# Patient Record
Sex: Male | Born: 1960 | Race: Black or African American | Hispanic: No | Marital: Married | Smoking: Never smoker
Health system: Southern US, Community
[De-identification: ages and names within clinical notes are randomized; demographics above are authoritative.]

## PROBLEM LIST (undated history)

## (undated) DIAGNOSIS — R3915 Urgency of urination: Secondary | ICD-10-CM

## (undated) DIAGNOSIS — R531 Weakness: Secondary | ICD-10-CM

## (undated) DIAGNOSIS — M1711 Unilateral primary osteoarthritis, right knee: Secondary | ICD-10-CM

## (undated) DIAGNOSIS — Z96651 Presence of right artificial knee joint: Secondary | ICD-10-CM

## (undated) DIAGNOSIS — K219 Gastro-esophageal reflux disease without esophagitis: Secondary | ICD-10-CM

## (undated) DIAGNOSIS — R350 Frequency of micturition: Secondary | ICD-10-CM

## (undated) DIAGNOSIS — M2540 Effusion, unspecified joint: Secondary | ICD-10-CM

## (undated) DIAGNOSIS — M1712 Unilateral primary osteoarthritis, left knee: Secondary | ICD-10-CM

## (undated) DIAGNOSIS — R569 Unspecified convulsions: Secondary | ICD-10-CM

## (undated) DIAGNOSIS — E119 Type 2 diabetes mellitus without complications: Secondary | ICD-10-CM

## (undated) DIAGNOSIS — Z8719 Personal history of other diseases of the digestive system: Secondary | ICD-10-CM

## (undated) DIAGNOSIS — E7800 Pure hypercholesterolemia, unspecified: Secondary | ICD-10-CM

## (undated) DIAGNOSIS — I10 Essential (primary) hypertension: Secondary | ICD-10-CM

## (undated) DIAGNOSIS — R351 Nocturia: Secondary | ICD-10-CM

## (undated) DIAGNOSIS — K5900 Constipation, unspecified: Secondary | ICD-10-CM

## (undated) DIAGNOSIS — M2550 Pain in unspecified joint: Secondary | ICD-10-CM

## (undated) HISTORY — PX: KNEE ARTHROSCOPY: SHX127

## (undated) HISTORY — PX: MOUTH SURGERY: SHX715

## (undated) HISTORY — PX: ANKLE FRACTURE SURGERY: SHX122

## (undated) HISTORY — DX: Gastro-esophageal reflux disease without esophagitis: K21.9

## (undated) HISTORY — PX: JOINT REPLACEMENT: SHX530

## (undated) HISTORY — PX: COLONOSCOPY: SHX174

## (undated) HISTORY — DX: Essential (primary) hypertension: I10

## (undated) HISTORY — PX: HAMMER TOE SURGERY: SHX385

---
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
## 1999-09-07 ENCOUNTER — Emergency Department (HOSPITAL_COMMUNITY): Admission: EM | Admit: 1999-09-07 | Discharge: 1999-09-07 | Payer: Self-pay | Admitting: Emergency Medicine

## 1999-09-07 ENCOUNTER — Encounter: Payer: Self-pay | Admitting: Emergency Medicine

## 1999-10-17 ENCOUNTER — Ambulatory Visit (HOSPITAL_BASED_OUTPATIENT_CLINIC_OR_DEPARTMENT_OTHER): Admission: RE | Admit: 1999-10-17 | Discharge: 1999-10-17 | Payer: Self-pay | Admitting: Orthopedic Surgery

## 2001-03-29 ENCOUNTER — Emergency Department (HOSPITAL_COMMUNITY): Admission: EM | Admit: 2001-03-29 | Discharge: 2001-03-29 | Payer: Self-pay | Admitting: Emergency Medicine

## 2001-03-29 ENCOUNTER — Encounter: Payer: Self-pay | Admitting: Emergency Medicine

## 2012-02-25 ENCOUNTER — Emergency Department (HOSPITAL_COMMUNITY)
Admission: EM | Admit: 2012-02-25 | Discharge: 2012-02-25 | Disposition: A | Discharge status: Home / Self Care | Payer: No Typology Code available for payment source | Attending: Emergency Medicine | Admitting: Emergency Medicine

## 2012-02-25 ENCOUNTER — Encounter (HOSPITAL_COMMUNITY): Payer: Self-pay | Admitting: *Deleted

## 2012-02-25 ENCOUNTER — Emergency Department (HOSPITAL_COMMUNITY): Payer: No Typology Code available for payment source

## 2012-02-25 DIAGNOSIS — M549 Dorsalgia, unspecified: Secondary | ICD-10-CM

## 2012-02-25 DIAGNOSIS — Y9389 Activity, other specified: Secondary | ICD-10-CM | POA: Insufficient documentation

## 2012-02-25 DIAGNOSIS — IMO0002 Reserved for concepts with insufficient information to code with codable children: Secondary | ICD-10-CM | POA: Insufficient documentation

## 2012-02-25 DIAGNOSIS — M25559 Pain in unspecified hip: Secondary | ICD-10-CM

## 2012-02-25 DIAGNOSIS — Y92410 Unspecified street and highway as the place of occurrence of the external cause: Secondary | ICD-10-CM | POA: Insufficient documentation

## 2012-02-25 DIAGNOSIS — S79919A Unspecified injury of unspecified hip, initial encounter: Secondary | ICD-10-CM | POA: Insufficient documentation

## 2012-02-25 DIAGNOSIS — S79929A Unspecified injury of unspecified thigh, initial encounter: Secondary | ICD-10-CM | POA: Insufficient documentation

## 2012-02-25 MED ORDER — OXYCODONE-ACETAMINOPHEN 5-325 MG PO TABS
2.0000 | ORAL_TABLET | Freq: Once | ORAL | Status: AC
  Administered 2012-02-25: 2 via ORAL
  Filled 2012-02-25: qty 2

## 2012-02-25 MED ORDER — OXYCODONE-ACETAMINOPHEN 5-325 MG PO TABS: 1.0000 | ORAL_TABLET | Freq: Four times a day (QID) | ORAL | Status: DC | PRN

## 2012-02-25 MED ORDER — METHOCARBAMOL 500 MG PO TABS
500.0000 mg | ORAL_TABLET | Freq: Once | ORAL | Status: AC
  Administered 2012-02-25: 500 mg via ORAL
  Filled 2012-02-25: qty 1

## 2012-02-25 MED ORDER — METHOCARBAMOL 500 MG PO TABS: 500.0000 mg | ORAL_TABLET | Freq: Two times a day (BID) | ORAL | Status: DC
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
## 2012-02-25 NOTE — ED Notes (Signed)
Pain extends to lower back, right knee, right hip. A&Ox4, ambulatory, nad.
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
## 2012-02-26 NOTE — ED Provider Notes (Signed)
Medical screening examination/treatment/procedure(s) were performed by non-physician practitioner and as supervising physician I was immediately available for consultation/collaboration.   Carleene Cooper III, MD 02/26/12 [phone]

## 2012-03-14 ENCOUNTER — Encounter (HOSPITAL_COMMUNITY): Payer: Self-pay | Admitting: Emergency Medicine

## 2012-03-14 ENCOUNTER — Emergency Department (INDEPENDENT_AMBULATORY_CARE_PROVIDER_SITE_OTHER)
Admission: EM | Admit: 2012-03-14 | Discharge: 2012-03-14 | Disposition: A | Discharge status: Home / Self Care | Payer: Self-pay | Source: Home / Self Care | Attending: Family Medicine | Admitting: Family Medicine

## 2012-03-14 DIAGNOSIS — M549 Dorsalgia, unspecified: Secondary | ICD-10-CM

## 2012-03-14 DIAGNOSIS — M25569 Pain in unspecified knee: Secondary | ICD-10-CM

## 2012-03-14 MED ORDER — KETOROLAC TROMETHAMINE 60 MG/2ML IM SOLN
60.0000 mg | Freq: Once | INTRAMUSCULAR | Status: AC
  Administered 2012-03-14: 60 mg via INTRAMUSCULAR

## 2012-03-14 MED ORDER — KETOROLAC TROMETHAMINE 60 MG/2ML IM SOLN
INTRAMUSCULAR | Status: AC
  Filled 2012-03-14: qty 2

## 2012-03-14 MED ORDER — MELOXICAM 15 MG PO TABS: 15.0000 mg | ORAL_TABLET | Freq: Every day | ORAL | Status: DC

## 2012-03-14 MED ORDER — PREDNISONE 20 MG PO TABS: 20.0000 mg | ORAL_TABLET | Freq: Two times a day (BID) | ORAL | Status: DC

## 2012-03-14 MED ORDER — TRAMADOL HCL 50 MG PO TABS: 50.0000 mg | ORAL_TABLET | Freq: Four times a day (QID) | ORAL | Status: DC | PRN
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
## 2012-03-15 NOTE — ED Provider Notes (Signed)
Medical screening examination/treatment/procedure(s) were performed by resident physician or non-physician practitioner and as supervising physician I was immediately available for consultation/collaboration.   Jennalee Greaves DOUGLAS MD.    Jenee Spaugh D Janette Harvie, MD 03/15/12 1822 

## 2013-03-30 ENCOUNTER — Other Ambulatory Visit: Payer: Self-pay | Admitting: Family Medicine

## 2013-03-30 ENCOUNTER — Ambulatory Visit
Admission: RE | Admit: 2013-03-30 | Discharge: 2013-03-30 | Disposition: A | Discharge status: Home / Self Care | Payer: Commercial Managed Care - HMO | Source: Ambulatory Visit | Attending: Family Medicine | Admitting: Family Medicine

## 2013-03-30 DIAGNOSIS — Z006 Encounter for examination for normal comparison and control in clinical research program: Secondary | ICD-10-CM

## 2013-10-14 ENCOUNTER — Ambulatory Visit (INDEPENDENT_AMBULATORY_CARE_PROVIDER_SITE_OTHER): Payer: Commercial Managed Care - HMO | Admitting: Family Medicine

## 2013-10-14 ENCOUNTER — Ambulatory Visit (INDEPENDENT_AMBULATORY_CARE_PROVIDER_SITE_OTHER): Payer: Commercial Managed Care - HMO

## 2013-10-14 VITALS — BP 124/76 | HR 76 | Temp 98.4°F | Resp 18

## 2013-10-14 DIAGNOSIS — M25579 Pain in unspecified ankle and joints of unspecified foot: Secondary | ICD-10-CM

## 2013-10-14 DIAGNOSIS — M25569 Pain in unspecified knee: Secondary | ICD-10-CM

## 2013-10-14 DIAGNOSIS — M170 Bilateral primary osteoarthritis of knee: Secondary | ICD-10-CM

## 2013-10-14 DIAGNOSIS — M25571 Pain in right ankle and joints of right foot: Secondary | ICD-10-CM

## 2013-10-14 DIAGNOSIS — M25561 Pain in right knee: Secondary | ICD-10-CM

## 2013-10-14 DIAGNOSIS — M1710 Unilateral primary osteoarthritis, unspecified knee: Secondary | ICD-10-CM

## 2013-10-14 NOTE — Patient Instructions (Addendum)
I suspect your knee pain is prior arthritis pain, but will check uric acid to look at gout as possibility. If pain persists - can follow up with Dr. Alfonso Ramus as discussed.  Since we can not use antiinflammatories due to your esophagus issues, ok to continue tramadol as needed for your pain.  Wear the walking boot if your other ankle brace is not controlling the pain. Use crutches you have or cane if needed for stability when walking with the walking boot.  Elevate the ankle, ice to area, and if ankle not improving in next week - return here or with Dr. Alfonso Ramus.  Return to the clinic or go to the nearest emergency room if any of your symptoms worsen or new symptoms occur.
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
## 2013-10-15 LAB — URIC ACID: URIC ACID, SERUM: 5 mg/dL (ref 4.0–7.8)

## 2013-10-16 ENCOUNTER — Encounter: Payer: Self-pay | Admitting: Family Medicine

## 2013-10-20 ENCOUNTER — Other Ambulatory Visit: Payer: Self-pay | Admitting: Family Medicine

## 2013-11-28 ENCOUNTER — Ambulatory Visit: Payer: Commercial Managed Care - HMO | Admitting: Podiatry

## 2013-12-04 ENCOUNTER — Ambulatory Visit (INDEPENDENT_AMBULATORY_CARE_PROVIDER_SITE_OTHER): Payer: Commercial Managed Care - HMO | Admitting: Sports Medicine

## 2013-12-04 ENCOUNTER — Encounter: Payer: Self-pay | Admitting: Sports Medicine

## 2013-12-04 VITALS — BP 115/85 | HR 101 | Ht 79.0 in | Wt 265.0 lb

## 2013-12-04 DIAGNOSIS — M2170 Unequal limb length (acquired), unspecified site: Secondary | ICD-10-CM

## 2013-12-04 DIAGNOSIS — M25579 Pain in unspecified ankle and joints of unspecified foot: Secondary | ICD-10-CM

## 2013-12-04 DIAGNOSIS — M79671 Pain in right foot: Secondary | ICD-10-CM | POA: Insufficient documentation

## 2013-12-04 NOTE — Assessment & Plan Note (Addendum)
Gait is affected with this difference. He swings with his right leg and supinates.  - heel lift 5/16" added to right foot green sport insole  - didn't completely correct. Will try to correct more based on his trail after three weeks
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
## 2013-12-04 NOTE — Assessment & Plan Note (Addendum)
Most likely related to previous crush injury and chronic changes as seen on films. Patient has a pes cavus but valgus while standing. His arches are effected due to this. Will try the green sport insoles to begin with.  - green sport insole  - scaphoid pad added b/l  - heel lift (5/16") placed on right due to leg length discrepancy. Did not completely correct leg length difference  - will try this for three weeks then f/u for consideration of custom orthotics. Will need further correction of his leg length discrepancy.

## 2013-12-27 ENCOUNTER — Encounter: Payer: Self-pay | Admitting: Sports Medicine

## 2013-12-27 ENCOUNTER — Ambulatory Visit (INDEPENDENT_AMBULATORY_CARE_PROVIDER_SITE_OTHER): Payer: Commercial Managed Care - HMO | Admitting: Sports Medicine

## 2013-12-27 VITALS — BP 114/72 | Ht 79.0 in | Wt 265.0 lb

## 2013-12-27 DIAGNOSIS — M25579 Pain in unspecified ankle and joints of unspecified foot: Secondary | ICD-10-CM

## 2013-12-27 DIAGNOSIS — M2170 Unequal limb length (acquired), unspecified site: Secondary | ICD-10-CM

## 2013-12-27 NOTE — Progress Notes (Signed)
Patient ID: Ryan Bright, male   DOB: 1961-02-15, 53 y.o.   MRN: [id number]  Patient comes in today for custom orthotics. He has noticed quite a bit of improvement with his green sports insoles with a 5/16 inch lift on the right to help partially correct a 3 cm leg length difference. He has discontinued his ankle brace as he feels like the inserts, particularly the correction in his leg length discrepancy, has help provide him with more ankle stability. He is here today with his wife.  Custom orthotics were constructed for the patient today. A total of 30 minutes was spent with the patient with greater than 50% of that time in face-to-face consultation discussing orthotic construction, fitting, and instruction. Patient found the orthotics very comfortable prior to leaving the office. He still has dynamic genu valgus with walking but the custom orthotics better correct his leg length discrepancy. I've asked him to follow-up with me in another 3-4 weeks.  Patient was fitted for a : standard, cushioned, semi-rigid orthotic. The orthotic was heated and afterward the patient stood on the orthotic blank positioned on the orthotic stand. The patient was positioned in subtalar neutral position and 10 degrees of ankle dorsiflexion in a weight bearing stance. After completion of molding, a stable base was applied to the orthotic blank. The blank was ground to a stable position for weight bearing. Size:16 Base: blue EVA Posting: none Additional orthotic padding: none  **I added two of the blue EVA bases to the right orthotic in an effort to better correct his leg length discrepancy. This seemed to work quite well but I will re-evaluate when he returns in 3 weeks.

## 2014-01-17 ENCOUNTER — Ambulatory Visit (INDEPENDENT_AMBULATORY_CARE_PROVIDER_SITE_OTHER): Payer: Commercial Managed Care - HMO | Admitting: Sports Medicine

## 2014-01-17 ENCOUNTER — Encounter: Payer: Self-pay | Admitting: Sports Medicine

## 2014-01-17 VITALS — BP 121/75 | Ht 79.0 in | Wt 260.0 lb

## 2014-01-17 DIAGNOSIS — M25579 Pain in unspecified ankle and joints of unspecified foot: Secondary | ICD-10-CM

## 2014-01-18 NOTE — Progress Notes (Signed)
Patient ID: Ryan Bright, male   DOB: 12/15/1960, 53 y.o.   MRN: [id number]  Patient comes in today for follow-up. Since our last office visit the patient has return to the office to have his orthotics adjusted. We added an extra layer of blue EVA to the base of the right insert to help correct a leg length discrepancy but the patient found that this was too uncomfortable and did not fit well in his shoe. He returned to the office and Governor Specking was able to grind down the base which has made it much more comfortable. Since his last office visit he has also seen Dr. Noemi Chapel. He has had chronic right knee pain and recently got a cortisone injection which has helped but he is scheduled to undergo total knee arthroplasty on February 15. Although his orthotic does not give him a full correction of his leg length discrepancy he does find it to be quite comfortable and he is having much less foot pain. Since he is also planning to undergo total knee arthroplasty soon which may further change the length of his right leg I am going to hold on any further adjustments at this time. Patient will follow-up with me once he has been released from Dr. Noemi Chapel at which point we will decide whether or not we need to make any further adjustments to his orthotics.

## 2014-03-21 ENCOUNTER — Encounter (HOSPITAL_COMMUNITY): Payer: Self-pay | Admitting: Physician Assistant

## 2014-03-21 ENCOUNTER — Other Ambulatory Visit (HOSPITAL_COMMUNITY): Payer: Commercial Managed Care - HMO

## 2014-03-21 DIAGNOSIS — M1611 Unilateral primary osteoarthritis, right hip: Secondary | ICD-10-CM | POA: Diagnosis not present

## 2014-03-21 DIAGNOSIS — I10 Essential (primary) hypertension: Secondary | ICD-10-CM | POA: Diagnosis present

## 2014-03-21 DIAGNOSIS — K219 Gastro-esophageal reflux disease without esophagitis: Secondary | ICD-10-CM | POA: Diagnosis present

## 2014-03-21 DIAGNOSIS — M1711 Unilateral primary osteoarthritis, right knee: Secondary | ICD-10-CM | POA: Diagnosis present

## 2014-03-21 NOTE — H&P (Signed)
TOTAL KNEE ADMISSION H&P  Patient is being admitted for right total knee arthroplasty.  Subjective:  Chief Complaint:right knee pain.  HPI: Ryan Bright, 54 y.o. male, has a history of pain and functional disability in the right knee due to arthritis and has failed non-surgical conservative treatments for greater than 12 weeks to includeNSAID's and/or analgesics, corticosteriod injections, viscosupplementation injections, flexibility and strengthening excercises, supervised PT with diminished ADL's post treatment, use of assistive devices and activity modification.  Onset of symptoms was gradual, starting 10 years ago with gradually worsening course since that time. The patient noted prior procedures on the knee to include  arthroscopy and menisectomy on the right knee(s).  Patient currently rates pain in the right knee(s) at 10 out of 10 with activity. Patient has night pain, worsening of pain with activity and weight bearing, pain that interferes with activities of daily living, crepitus and joint swelling.  Patient has evidence of subchondral sclerosis, periarticular osteophytes and joint space narrowing by imaging studies.  There is no active infection.  Patient Active Problem List   Diagnosis Date Noted  . Primary localized osteoarthritis of right knee   . GERD (gastroesophageal reflux disease)   . Hypertension   . Right foot pain 12/04/2013  . Leg length discrepancy 12/04/2013   Past Medical History  Diagnosis Date  . Hypertension   . GERD (gastroesophageal reflux disease)   . Primary localized osteoarthritis of right knee     Past Surgical History  Procedure Laterality Date  . Knee surgery      bilateral  . Foot surgery    . Ankle surgery    . Head surgery    . Mouth surgery      No current facility-administered medications for this encounter.  Current outpatient prescriptions:  .  amLODipine (NORVASC) 10 MG tablet, Take 10 mg by mouth daily., Disp: , Rfl:  .  aspirin 325  MG tablet, Take 325 mg by mouth daily., Disp: , Rfl:  .  lisinopril (PRINIVIL,ZESTRIL) 40 MG tablet, Take 40 mg by mouth daily., Disp: , Rfl:  .  pantoprazole (PROTONIX) 40 MG tablet, Take 40 mg by mouth 2 (two) times daily., Disp: , Rfl:  .  predniSONE (DELTASONE) 20 MG tablet, Take 1 tablet (20 mg total) by mouth 2 (two) times daily., Disp: 6 tablet, Rfl: 0 .  traMADol (ULTRAM) 50 MG tablet, Take 1 tablet (50 mg total) by mouth every 6 (six) hours as needed for pain., Disp: 15 tablet, Rfl: 0 .  FLUVIRIN 0.5 ML SUSY, , Disp: , Rfl:  .  methocarbamol (ROBAXIN) 500 MG tablet, Take 1 tablet (500 mg total) by mouth 2 (two) times daily. (Patient not taking: Reported on 03/20/2014), Disp: 20 tablet, Rfl: 0 .  oxyCODONE-acetaminophen (PERCOCET) 5-325 MG per tablet, Take 1-2 tablets by mouth every 6 (six) hours as needed for pain. (Patient not taking: Reported on 03/20/2014), Disp: 8 tablet, Rfl: 0 Allergies  Allergen Reactions  . Meloxicam Other (See Comments)    irritated esophagus causing swelling and closure affecting breathing     History  Substance Use Topics  . Smoking status: Never Smoker   . Smokeless tobacco: Not on file  . Alcohol Use: No    Family History  Problem Relation Age of Onset  . Cancer Mother   . Diabetes Mother   . Heart attack Father   . Cancer Sister   . Diabetes Brother      Review of Systems  Constitutional: Negative.  HENT: Negative.   Eyes: Negative.   Respiratory: Negative.   Cardiovascular: Negative.   Gastrointestinal: Negative.   Genitourinary: Negative.   Musculoskeletal: Positive for joint pain.       Right knee pain  Skin: Negative.   Neurological: Negative.   Endo/Heme/Allergies: Negative.   Psychiatric/Behavioral: Negative.     Objective:  Physical Exam  Constitutional: He is oriented to person, place, and time. He appears well-developed and well-nourished.  HENT:  Head: Normocephalic and atraumatic.  Mouth/Throat: Oropharynx is clear and  moist.  Eyes: Conjunctivae and EOM are normal. Pupils are equal, round, and reactive to light.  Neck: Neck supple.  Cardiovascular: Normal rate, regular rhythm and normal heart sounds.   Respiratory: Effort normal and breath sounds normal.  GI: Soft.  Genitourinary:  Not pertinent to current symptomatology therefore not examined.  Musculoskeletal:  Examination of his right knee reveals moderate valgus deformity pain medially and laterally 1+ synovitis 2+ crepitation, range of motion is from -5 to 135 degrees knee is stable with normal patella tracking. Exam of the left knee reveals full range of motion without pain swelling weakness or instability. Vascular exam: pulses 2+ and symmetric.  Neurological: He is alert and oriented to person, place, and time.  Skin: Skin is warm and dry.  Psychiatric: He has a normal mood and affect. His behavior is normal.    Vital signs in last 24 hours: Temp:  [98.6 F (37 C)] 98.6 F (37 C) (02/03 1300) Pulse Rate:  [77] 77 (02/03 1300) BP: (135)/(73) 135/73 mmHg (02/03 1300) SpO2:  [96 %] 96 % (02/03 1300) Weight:  [112.946 kg (249 lb)] 112.946 kg (249 lb) (02/03 1300)  Labs:   Estimated body mass index is 28.04 kg/(m^2) as calculated from the following:   Height as of this encounter: 6\' 7"  (2.007 m).   Weight as of this encounter: 112.946 kg (249 lb).   Imaging Review Plain radiographs demonstrate severe degenerative joint disease of the right knee(s). The overall alignment issignificant valgus. The bone quality appears to be good for age and reported activity level.  Assessment/Plan:  End stage arthritis, right knee  Principal Problem:   Primary localized osteoarthritis of right knee Active Problems:   Leg length discrepancy   GERD (gastroesophageal reflux disease)   Hypertension   The patient history, physical examination, clinical judgment of the provider and imaging studies are consistent with end stage degenerative joint disease of  the right knee(s) and total knee arthroplasty is deemed medically necessary. The treatment options including medical management, injection therapy arthroscopy and arthroplasty were discussed at length. The risks and benefits of total knee arthroplasty were presented and reviewed. The risks due to aseptic loosening, infection, stiffness, patella tracking problems, thromboembolic complications and other imponderables were discussed. The patient acknowledged the explanation, agreed to proceed with the plan and consent was signed. Patient is being admitted for inpatient treatment for surgery, pain control, PT, OT, prophylactic antibiotics, VTE prophylaxis, progressive ambulation and ADL's and discharge planning. The patient is planning to be discharged home with home health services  Tricia Oaxaca A. Kaleen Mask Physician Assistant [facility] [phone]  03/21/2014, 3:03 PM

## 2014-03-22 ENCOUNTER — Encounter (HOSPITAL_COMMUNITY)
Admission: RE | Admit: 2014-03-22 | Discharge: 2014-03-22 | Disposition: A | Discharge status: Home / Self Care | Payer: Medicare HMO | Source: Ambulatory Visit | Attending: Orthopedic Surgery | Admitting: Orthopedic Surgery

## 2014-03-22 ENCOUNTER — Encounter (HOSPITAL_COMMUNITY): Payer: Self-pay

## 2014-03-22 DIAGNOSIS — M179 Osteoarthritis of knee, unspecified: Secondary | ICD-10-CM | POA: Insufficient documentation

## 2014-03-22 DIAGNOSIS — Z0183 Encounter for blood typing: Secondary | ICD-10-CM | POA: Diagnosis not present

## 2014-03-22 DIAGNOSIS — Z01810 Encounter for preprocedural cardiovascular examination: Secondary | ICD-10-CM | POA: Insufficient documentation

## 2014-03-22 DIAGNOSIS — Z7982 Long term (current) use of aspirin: Secondary | ICD-10-CM | POA: Diagnosis not present

## 2014-03-22 DIAGNOSIS — Z01812 Encounter for preprocedural laboratory examination: Secondary | ICD-10-CM | POA: Insufficient documentation

## 2014-03-22 HISTORY — DX: Urgency of urination: R39.15

## 2014-03-22 HISTORY — DX: Frequency of micturition: R35.0

## 2014-03-22 HISTORY — DX: Weakness: R53.1

## 2014-03-22 HISTORY — DX: Unspecified convulsions: R56.9

## 2014-03-22 HISTORY — DX: Constipation, unspecified: K59.00

## 2014-03-22 HISTORY — DX: Nocturia: R35.1

## 2014-03-22 HISTORY — DX: Effusion, unspecified joint: M25.40

## 2014-03-22 HISTORY — DX: Personal history of other diseases of the digestive system: Z87.19

## 2014-03-22 HISTORY — DX: Pain in unspecified joint: M25.50

## 2014-03-22 LAB — PROTIME-INR
INR: 0.99 (ref 0.00–1.49)
PROTHROMBIN TIME: 13.2 s (ref 11.6–15.2)

## 2014-03-22 LAB — COMPREHENSIVE METABOLIC PANEL
ALBUMIN: 4.1 g/dL (ref 3.5–5.2)
ALT: 18 U/L (ref 0–53)
AST: 22 U/L (ref 0–37)
Alkaline Phosphatase: 108 U/L (ref 39–117)
Anion gap: 9 (ref 5–15)
BUN: 7 mg/dL (ref 6–23)
CHLORIDE: 105 mmol/L (ref 96–112)
CO2: 27 mmol/L (ref 19–32)
CREATININE: 0.94 mg/dL (ref 0.50–1.35)
Calcium: 9.3 mg/dL (ref 8.4–10.5)
GFR calc Af Amer: >90 mL/min (ref >90)
GFR calc non Af Amer: >90 mL/min (ref >90)
GLUCOSE: 86 mg/dL (ref 70–99)
POTASSIUM: 3.9 mmol/L (ref 3.5–5.1)
SODIUM: 141 mmol/L (ref 135–145)
TOTAL PROTEIN: 7.5 g/dL (ref 6.0–8.3)
Total Bilirubin: 0.6 mg/dL (ref 0.3–1.2)

## 2014-03-22 LAB — TYPE AND SCREEN
ABO/RH(D): AB POS
Antibody Screen: NEGATIVE

## 2014-03-22 LAB — URINALYSIS, ROUTINE W REFLEX MICROSCOPIC
Bilirubin Urine: NEGATIVE
GLUCOSE, UA: NEGATIVE mg/dL
HGB URINE DIPSTICK: NEGATIVE
KETONES UR: NEGATIVE mg/dL
Leukocytes, UA: NEGATIVE
Nitrite: NEGATIVE
PROTEIN: NEGATIVE mg/dL
SPECIFIC GRAVITY, URINE: 1.013 (ref 1.005–1.030)
Urobilinogen, UA: 1 mg/dL (ref 0.0–1.0)
pH: 7.5 (ref 5.0–8.0)

## 2014-03-22 LAB — ABO/RH: ABO/RH(D): AB POS

## 2014-03-22 LAB — CBC WITH DIFFERENTIAL/PLATELET
BASOS PCT: 1 % (ref 0–1)
Basophils Absolute: 0 10*3/uL (ref 0.0–0.1)
EOS ABS: 0.2 10*3/uL (ref 0.0–0.7)
Eosinophils Relative: 4 % (ref 0–5)
HCT: 38.5 % — ABNORMAL LOW (ref 39.0–52.0)
Hemoglobin: 12.6 g/dL — ABNORMAL LOW (ref 13.0–17.0)
LYMPHS ABS: 3.3 10*3/uL (ref 0.7–4.0)
Lymphocytes Relative: 61 % — ABNORMAL HIGH (ref 12–46)
MCH: 27.5 pg (ref 26.0–34.0)
MCHC: 32.7 g/dL (ref 30.0–36.0)
MCV: 83.9 fL (ref 78.0–100.0)
Monocytes Absolute: 0.6 10*3/uL (ref 0.1–1.0)
Monocytes Relative: 11 % (ref 3–12)
Neutro Abs: 1.2 10*3/uL — ABNORMAL LOW (ref 1.7–7.7)
Neutrophils Relative %: 23 % — ABNORMAL LOW (ref 43–77)
Platelets: 273 10*3/uL (ref 150–400)
RBC: 4.59 MIL/uL (ref 4.22–5.81)
RDW: 13.2 % (ref 11.5–15.5)
WBC: 5.3 10*3/uL (ref 4.0–10.5)

## 2014-03-22 LAB — SURGICAL PCR SCREEN
MRSA, PCR: NEGATIVE
Staphylococcus aureus: NEGATIVE

## 2014-03-22 LAB — APTT: aPTT: 30 seconds (ref 24–37)

## 2014-03-22 MED ORDER — POVIDONE-IODINE 7.5 % EX SOLN: Freq: Once | CUTANEOUS | Status: DC

## 2014-03-22 MED ORDER — CHLORHEXIDINE GLUCONATE 4 % EX LIQD: 60.0000 mL | Freq: Once | CUTANEOUS | Status: DC

## 2014-03-22 NOTE — Pre-Procedure Instructions (Signed)
Ryan Bright  03/22/2014   Your procedure is scheduled on:  Mon, Feb 15 @ 7:15 AM  Report to [facility]  at 5:30 AM.  Call this number if you have problems the morning of surgery: [phone]   Remember:   Do not eat food or drink liquids after midnight.   Take these medicines the morning of surgery with A SIP OF WATER: Amlodipine(Norvasc),Pain Pill(if needed),Protonix(Pantoprazole),Prednisone(Deltasone),and Tramadol(Ultram-if needed)              Stop taking your Aspirin a week before surgery. No Goody's,BC's,Aleve,Ibuprofen,Fish Oil,or any Herbal Medications   Do not wear jewelry  Do not wear lotions, powders, or colognes. You may wear deodorant.  Men may shave face and neck.  Do not bring valuables to the hospital.  [facility] is not responsible                  for any belongings or valuables.               Contacts, dentures or bridgework may not be worn into surgery.  Leave suitcase in the car. After surgery it may be brought to your room.  For patients admitted to the hospital, discharge time is determined by your                treatment team.                   Special Instructions:  [place] - Preparing for Surgery  Before surgery, you can play an important role.  Because skin is not sterile, your skin needs to be as free of germs as possible.  You can reduce the number of germs on you skin by washing with CHG (chlorahexidine gluconate) soap before surgery.  CHG is an antiseptic cleaner which kills germs and bonds with the skin to continue killing germs even after washing.  Please DO NOT use if you have an allergy to CHG or antibacterial soaps.  If your skin becomes reddened/irritated stop using the CHG and inform your nurse when you arrive at Short Stay.  Do not shave (including legs and underarms) for at least 48 hours prior to the first CHG shower.  You may shave your face.  Please follow these instructions carefully:   1.  Shower with CHG Soap the night  before surgery and the                                morning of Surgery.  2.  If you choose to wash your hair, wash your hair first as usual with your       normal shampoo.  3.  After you shampoo, rinse your hair and body thoroughly to remove the                      Shampoo.  4.  Use CHG as you would any other liquid soap.  You can apply chg directly       to the skin and wash gently with scrungie or a clean washcloth.  5.  Apply the CHG Soap to your body ONLY FROM THE NECK DOWN.        Do not use on open wounds or open sores.  Avoid contact with your eyes,       ears, mouth and genitals (private parts).  Wash genitals (private parts)  with your normal soap.  6.  Wash thoroughly, paying special attention to the area where your surgery        will be performed.  7.  Thoroughly rinse your body with warm water from the neck down.  8.  DO NOT shower/wash with your normal soap after using and rinsing off       the CHG Soap.  9.  Pat yourself dry with a clean towel.            10.  Wear clean pajamas.            11.  Place clean sheets on your bed the night of your first shower and do not        sleep with pets.  Day of Surgery  Do not apply any lotions/deoderants the morning of surgery.  Please wear clean clothes to the hospital/surgery center.     Please read over the following fact sheets that you were given: Pain Booklet, Coughing and Deep Breathing, Blood Transfusion Information, MRSA Information and Surgical Site Infection Prevention

## 2014-03-22 NOTE — Progress Notes (Signed)
Was told by Elnita Maxwell PA with [facility] yesterday not to stop his ASA but to drop from 325mg  to 81mg  daily  [facility] Dr[facility]Vyvyan Nancy Fetter is Medical Md  Denies EKG or CXR in past yr  Denies ever having an echo/stress test/heart cath

## 2014-03-22 NOTE — Progress Notes (Signed)
   03/22/14 1541  OBSTRUCTIVE SLEEP APNEA  Have you ever been diagnosed with sleep apnea through a sleep study? No  Do you snore loudly (loud enough to be heard through closed doors)?  1  Do you often feel tired, fatigued, or sleepy during the daytime? 0  Has anyone observed you stop breathing during your sleep? 0  Do you have, or are you being treated for high blood pressure? 1  BMI more than 35 kg/m2? 0  Age over 54 years old? 1  Neck circumference greater than 40 cm/16 inches? 1  Gender: 1  Obstructive Sleep Apnea Score 5  Score 4 or greater  Results sent to PCP

## 2014-03-23 LAB — URINE CULTURE
Colony Count: NO GROWTH
Culture: NO GROWTH

## 2014-03-28 ENCOUNTER — Encounter (HOSPITAL_COMMUNITY): Payer: Self-pay | Admitting: Pharmacy Technician

## 2014-04-01 MED ORDER — CEFAZOLIN SODIUM-DEXTROSE 2-3 GM-% IV SOLR
2.0000 g | INTRAVENOUS | Status: AC
  Administered 2014-04-02: 2 g via INTRAVENOUS
  Filled 2014-04-01: qty 50

## 2014-04-02 ENCOUNTER — Inpatient Hospital Stay (HOSPITAL_COMMUNITY): Payer: Commercial Managed Care - HMO | Admitting: Anesthesiology

## 2014-04-02 ENCOUNTER — Inpatient Hospital Stay (HOSPITAL_COMMUNITY)
Admission: RE | Admit: 2014-04-02 | Discharge: 2014-04-03 | DRG: 470 | Disposition: A | Discharge status: Home Health | Payer: Commercial Managed Care - HMO | Source: Ambulatory Visit | Attending: Orthopedic Surgery | Admitting: Orthopedic Surgery

## 2014-04-02 ENCOUNTER — Encounter (HOSPITAL_COMMUNITY)
Admission: RE | Disposition: A | Discharge status: Home Health | Payer: Self-pay | Source: Ambulatory Visit | Attending: Orthopedic Surgery

## 2014-04-02 ENCOUNTER — Encounter (HOSPITAL_COMMUNITY): Payer: Self-pay | Admitting: Anesthesiology

## 2014-04-02 DIAGNOSIS — K5900 Constipation, unspecified: Secondary | ICD-10-CM | POA: Diagnosis not present

## 2014-04-02 DIAGNOSIS — M2170 Unequal limb length (acquired), unspecified site: Secondary | ICD-10-CM | POA: Diagnosis present

## 2014-04-02 DIAGNOSIS — M25561 Pain in right knee: Secondary | ICD-10-CM | POA: Diagnosis present

## 2014-04-02 DIAGNOSIS — M179 Osteoarthritis of knee, unspecified: Secondary | ICD-10-CM | POA: Diagnosis not present

## 2014-04-02 DIAGNOSIS — I10 Essential (primary) hypertension: Secondary | ICD-10-CM | POA: Diagnosis not present

## 2014-04-02 DIAGNOSIS — M2550 Pain in unspecified joint: Secondary | ICD-10-CM | POA: Diagnosis not present

## 2014-04-02 DIAGNOSIS — Z833 Family history of diabetes mellitus: Secondary | ICD-10-CM

## 2014-04-02 DIAGNOSIS — Z96651 Presence of right artificial knee joint: Secondary | ICD-10-CM | POA: Diagnosis not present

## 2014-04-02 DIAGNOSIS — G8918 Other acute postprocedural pain: Secondary | ICD-10-CM | POA: Diagnosis not present

## 2014-04-02 DIAGNOSIS — Z79899 Other long term (current) drug therapy: Secondary | ICD-10-CM

## 2014-04-02 DIAGNOSIS — M1711 Unilateral primary osteoarthritis, right knee: Secondary | ICD-10-CM | POA: Diagnosis not present

## 2014-04-02 DIAGNOSIS — K219 Gastro-esophageal reflux disease without esophagitis: Secondary | ICD-10-CM | POA: Diagnosis not present

## 2014-04-02 DIAGNOSIS — M1710 Unilateral primary osteoarthritis, unspecified knee: Secondary | ICD-10-CM | POA: Diagnosis present

## 2014-04-02 DIAGNOSIS — Z8249 Family history of ischemic heart disease and other diseases of the circulatory system: Secondary | ICD-10-CM

## 2014-04-02 DIAGNOSIS — Z7901 Long term (current) use of anticoagulants: Secondary | ICD-10-CM | POA: Diagnosis not present

## 2014-04-02 HISTORY — PX: TOTAL KNEE ARTHROPLASTY: SHX125

## 2014-04-02 HISTORY — DX: Unilateral primary osteoarthritis, right knee: M17.11

## 2014-04-02 SURGERY — ARTHROPLASTY, KNEE, TOTAL
Anesthesia: General | Laterality: Right

## 2014-04-02 MED ORDER — HYDROMORPHONE HCL 1 MG/ML IJ SOLN: 0.2500 mg | INTRAMUSCULAR | Status: DC | PRN

## 2014-04-02 MED ORDER — ACETAMINOPHEN 500 MG PO TABS
1000.0000 mg | ORAL_TABLET | Freq: Four times a day (QID) | ORAL | Status: AC
  Administered 2014-04-02 – 2014-04-03 (×4): 1000 mg via ORAL
  Filled 2014-04-02 (×4): qty 2

## 2014-04-02 MED ORDER — ONDANSETRON HCL 4 MG/2ML IJ SOLN
INTRAMUSCULAR | Status: AC
  Filled 2014-04-02: qty 2

## 2014-04-02 MED ORDER — PROPOFOL 10 MG/ML IV BOLUS
INTRAVENOUS | Status: AC
  Filled 2014-04-02: qty 20

## 2014-04-02 MED ORDER — OXYCODONE HCL 5 MG PO TABS: 5.0000 mg | ORAL_TABLET | Freq: Once | ORAL | Status: DC | PRN

## 2014-04-02 MED ORDER — PROMETHAZINE HCL 25 MG/ML IJ SOLN: 6.2500 mg | INTRAMUSCULAR | Status: DC | PRN

## 2014-04-02 MED ORDER — STERILE WATER FOR INJECTION IJ SOLN
INTRAMUSCULAR | Status: AC
  Filled 2014-04-02: qty 10

## 2014-04-02 MED ORDER — LACTATED RINGERS IV SOLN
INTRAVENOUS | Status: DC | PRN
  Administered 2014-04-02 (×2): via INTRAVENOUS

## 2014-04-02 MED ORDER — CEFAZOLIN SODIUM-DEXTROSE 2-3 GM-% IV SOLR
2.0000 g | Freq: Four times a day (QID) | INTRAVENOUS | Status: AC
  Administered 2014-04-02 (×2): 2 g via INTRAVENOUS
  Filled 2014-04-02 (×2): qty 50

## 2014-04-02 MED ORDER — BUPIVACAINE-EPINEPHRINE 0.25% -1:200000 IJ SOLN
INTRAMUSCULAR | Status: DC | PRN
Start: 2014-04-02 — End: 2014-04-02
  Administered 2014-04-02: 30 mL

## 2014-04-02 MED ORDER — PHENOL 1.4 % MT LIQD
1.0000 | OROMUCOSAL | Status: DC | PRN
  Filled 2014-04-02: qty 177

## 2014-04-02 MED ORDER — AMLODIPINE BESYLATE 10 MG PO TABS
10.0000 mg | ORAL_TABLET | Freq: Every day | ORAL | Status: DC
  Administered 2014-04-03: 10 mg via ORAL
  Filled 2014-04-02: qty 1

## 2014-04-02 MED ORDER — ACETAMINOPHEN 650 MG RE SUPP
650.0000 mg | Freq: Four times a day (QID) | RECTAL | Status: DC | PRN
Start: 2014-04-02 — End: 2014-04-03

## 2014-04-02 MED ORDER — PROPOFOL 10 MG/ML IV BOLUS
INTRAVENOUS | Status: DC | PRN
  Administered 2014-04-02: 20 mg via INTRAVENOUS

## 2014-04-02 MED ORDER — BUPIVACAINE IN DEXTROSE 0.75-8.25 % IT SOLN
INTRATHECAL | Status: DC | PRN
  Administered 2014-04-02: 2 mL via INTRATHECAL

## 2014-04-02 MED ORDER — DIPHENHYDRAMINE HCL 12.5 MG/5ML PO ELIX: 12.5000 mg | ORAL_SOLUTION | ORAL | Status: DC | PRN

## 2014-04-02 MED ORDER — POTASSIUM CHLORIDE IN NACL 20-0.9 MEQ/L-% IV SOLN
INTRAVENOUS | Status: DC
  Filled 2014-04-02 (×5): qty 1000

## 2014-04-02 MED ORDER — ACETAMINOPHEN 325 MG PO TABS: 650.0000 mg | ORAL_TABLET | Freq: Four times a day (QID) | ORAL | Status: DC | PRN

## 2014-04-02 MED ORDER — POLYETHYLENE GLYCOL 3350 17 G PO PACK
17.0000 g | PACK | Freq: Two times a day (BID) | ORAL | Status: DC
  Administered 2014-04-02 – 2014-04-03 (×2): 17 g via ORAL
  Filled 2014-04-02 (×2): qty 1

## 2014-04-02 MED ORDER — ONDANSETRON HCL 4 MG PO TABS: 4.0000 mg | ORAL_TABLET | Freq: Four times a day (QID) | ORAL | Status: DC | PRN

## 2014-04-02 MED ORDER — LACTATED RINGERS IV SOLN: INTRAVENOUS | Status: DC

## 2014-04-02 MED ORDER — HYDROMORPHONE HCL 1 MG/ML IJ SOLN
1.0000 mg | INTRAMUSCULAR | Status: DC | PRN
  Administered 2014-04-02 – 2014-04-03 (×2): 1 mg via INTRAVENOUS
  Filled 2014-04-02 (×2): qty 1

## 2014-04-02 MED ORDER — SUCCINYLCHOLINE CHLORIDE 20 MG/ML IJ SOLN
INTRAMUSCULAR | Status: AC
  Filled 2014-04-02: qty 1

## 2014-04-02 MED ORDER — MIDAZOLAM HCL 2 MG/2ML IJ SOLN
INTRAMUSCULAR | Status: AC
  Filled 2014-04-02: qty 2

## 2014-04-02 MED ORDER — ARTIFICIAL TEARS OP OINT
TOPICAL_OINTMENT | OPHTHALMIC | Status: AC
  Filled 2014-04-02: qty 3.5

## 2014-04-02 MED ORDER — LIDOCAINE HCL (CARDIAC) 20 MG/ML IV SOLN
INTRAVENOUS | Status: DC | PRN
  Administered 2014-04-02: 40 mg via INTRAVENOUS

## 2014-04-02 MED ORDER — ROCURONIUM BROMIDE 50 MG/5ML IV SOLN
INTRAVENOUS | Status: AC
  Filled 2014-04-02: qty 1

## 2014-04-02 MED ORDER — 0.9 % SODIUM CHLORIDE (POUR BTL) OPTIME
TOPICAL | Status: DC | PRN
  Administered 2014-04-02: 1000 mL

## 2014-04-02 MED ORDER — MENTHOL 3 MG MT LOZG: 1.0000 | LOZENGE | OROMUCOSAL | Status: DC | PRN

## 2014-04-02 MED ORDER — DEXAMETHASONE SODIUM PHOSPHATE 10 MG/ML IJ SOLN
INTRAMUSCULAR | Status: DC | PRN
  Administered 2014-04-02: 10 mg via INTRAVENOUS

## 2014-04-02 MED ORDER — DEXAMETHASONE SODIUM PHOSPHATE 10 MG/ML IJ SOLN
INTRAMUSCULAR | Status: AC
  Filled 2014-04-02: qty 1

## 2014-04-02 MED ORDER — METOCLOPRAMIDE HCL 5 MG PO TABS
5.0000 mg | ORAL_TABLET | Freq: Three times a day (TID) | ORAL | Status: DC | PRN
  Filled 2014-04-02: qty 2

## 2014-04-02 MED ORDER — MIDAZOLAM HCL 5 MG/5ML IJ SOLN
INTRAMUSCULAR | Status: DC | PRN
  Administered 2014-04-02 (×2): 1 mg via INTRAVENOUS

## 2014-04-02 MED ORDER — FENTANYL CITRATE 0.05 MG/ML IJ SOLN
INTRAMUSCULAR | Status: DC | PRN
  Administered 2014-04-02: 50 ug via INTRAVENOUS

## 2014-04-02 MED ORDER — METOCLOPRAMIDE HCL 5 MG/ML IJ SOLN: 5.0000 mg | Freq: Three times a day (TID) | INTRAMUSCULAR | Status: DC | PRN

## 2014-04-02 MED ORDER — OXYCODONE HCL 5 MG/5ML PO SOLN: 5.0000 mg | Freq: Once | ORAL | Status: DC | PRN

## 2014-04-02 MED ORDER — APIXABAN 2.5 MG PO TABS
2.5000 mg | ORAL_TABLET | Freq: Two times a day (BID) | ORAL | Status: DC
  Administered 2014-04-03: 2.5 mg via ORAL
  Filled 2014-04-02 (×2): qty 1

## 2014-04-02 MED ORDER — ALUM & MAG HYDROXIDE-SIMETH 200-200-20 MG/5ML PO SUSP: 30.0000 mL | ORAL | Status: DC | PRN

## 2014-04-02 MED ORDER — SODIUM CHLORIDE 0.9 % IR SOLN
Status: DC | PRN
  Administered 2014-04-02 (×3): 1000 mL

## 2014-04-02 MED ORDER — DOCUSATE SODIUM 100 MG PO CAPS
100.0000 mg | ORAL_CAPSULE | Freq: Two times a day (BID) | ORAL | Status: DC
  Administered 2014-04-02 – 2014-04-03 (×2): 100 mg via ORAL
  Filled 2014-04-02 (×2): qty 1

## 2014-04-02 MED ORDER — ONDANSETRON HCL 4 MG/2ML IJ SOLN: 4.0000 mg | Freq: Four times a day (QID) | INTRAMUSCULAR | Status: DC | PRN

## 2014-04-02 MED ORDER — LIDOCAINE HCL (CARDIAC) 20 MG/ML IV SOLN
INTRAVENOUS | Status: AC
  Filled 2014-04-02: qty 5

## 2014-04-02 MED ORDER — PANTOPRAZOLE SODIUM 40 MG PO TBEC
40.0000 mg | DELAYED_RELEASE_TABLET | Freq: Two times a day (BID) | ORAL | Status: DC
  Administered 2014-04-02 – 2014-04-03 (×2): 40 mg via ORAL
  Filled 2014-04-02 (×2): qty 1

## 2014-04-02 MED ORDER — ONDANSETRON HCL 4 MG/2ML IJ SOLN
INTRAMUSCULAR | Status: DC | PRN
  Administered 2014-04-02: 4 mg via INTRAVENOUS

## 2014-04-02 MED ORDER — PROPOFOL INFUSION 10 MG/ML OPTIME
INTRAVENOUS | Status: DC | PRN
  Administered 2014-04-02: 100 ug/kg/min via INTRAVENOUS

## 2014-04-02 MED ORDER — EPHEDRINE SULFATE 50 MG/ML IJ SOLN
INTRAMUSCULAR | Status: AC
  Filled 2014-04-02: qty 1

## 2014-04-02 MED ORDER — DEXAMETHASONE SODIUM PHOSPHATE 10 MG/ML IJ SOLN
10.0000 mg | Freq: Three times a day (TID) | INTRAMUSCULAR | Status: DC
  Administered 2014-04-02 (×2): 10 mg via INTRAVENOUS
  Filled 2014-04-02 (×2): qty 1

## 2014-04-02 MED ORDER — LISINOPRIL 40 MG PO TABS
40.0000 mg | ORAL_TABLET | Freq: Every day | ORAL | Status: DC
  Administered 2014-04-03: 40 mg via ORAL
  Filled 2014-04-02: qty 1

## 2014-04-02 MED ORDER — EPINEPHRINE HCL 0.1 MG/ML IJ SOSY
PREFILLED_SYRINGE | INTRAMUSCULAR | Status: DC | PRN
  Administered 2014-04-02: 100 ug via INTRAVENOUS

## 2014-04-02 MED ORDER — PHENYLEPHRINE 40 MCG/ML (10ML) SYRINGE FOR IV PUSH (FOR BLOOD PRESSURE SUPPORT)
PREFILLED_SYRINGE | INTRAVENOUS | Status: AC
  Filled 2014-04-02: qty 10

## 2014-04-02 MED ORDER — OXYCODONE HCL 5 MG PO TABS
5.0000 mg | ORAL_TABLET | ORAL | Status: DC | PRN
  Administered 2014-04-02 – 2014-04-03 (×4): 10 mg via ORAL
  Filled 2014-04-02 (×4): qty 2

## 2014-04-02 MED ORDER — FENTANYL CITRATE 0.05 MG/ML IJ SOLN
INTRAMUSCULAR | Status: AC
  Filled 2014-04-02: qty 5

## 2014-04-02 MED ORDER — BUPIVACAINE-EPINEPHRINE (PF) 0.25% -1:200000 IJ SOLN
INTRAMUSCULAR | Status: AC
  Filled 2014-04-02: qty 30

## 2014-04-02 SURGICAL SUPPLY — 76 items
BANDAGE ESMARK 6X9 LF (GAUZE/BANDAGES/DRESSINGS) ×1 IMPLANT
BENZOIN TINCTURE PRP APPL 2/3 (GAUZE/BANDAGES/DRESSINGS) ×3 IMPLANT
BLADE SAGITTAL 25.0X1.19X90 (BLADE) ×2 IMPLANT
BLADE SAGITTAL 25.0X1.19X90MM (BLADE) ×1
BLADE SAW SGTL 11.0X1.19X90.0M (BLADE) IMPLANT
BLADE SAW SGTL 13.0X1.19X90.0M (BLADE) ×3 IMPLANT
BLADE SURG 10 STRL SS (BLADE) ×6 IMPLANT
BNDG ELASTIC 6X15 VLCR STRL LF (GAUZE/BANDAGES/DRESSINGS) ×3 IMPLANT
BNDG ESMARK 6X9 LF (GAUZE/BANDAGES/DRESSINGS) ×3
BOWL SMART MIX CTS (DISPOSABLE) ×6 IMPLANT
CAP KNEE TOTAL 3 SIGMA ×3 IMPLANT
CEMENT HV SMART SET (Cement) ×6 IMPLANT
CLOSURE STERI-STRIP 1/2X4 (GAUZE/BANDAGES/DRESSINGS) ×1
CLOSURE WOUND 1/2 X4 (GAUZE/BANDAGES/DRESSINGS) ×1
CLSR STERI-STRIP ANTIMIC 1/2X4 (GAUZE/BANDAGES/DRESSINGS) ×2 IMPLANT
COVER SURGICAL LIGHT HANDLE (MISCELLANEOUS) ×3 IMPLANT
CUFF TOURNIQUET SINGLE 34IN LL (TOURNIQUET CUFF) ×3 IMPLANT
DRAPE EXTREMITY T 121X128X90 (DRAPE) ×3 IMPLANT
DRAPE IMP U-DRAPE 54X76 (DRAPES) ×3 IMPLANT
DRAPE INCISE IOBAN 66X45 STRL (DRAPES) ×3 IMPLANT
DRAPE PROXIMA HALF (DRAPES) ×6 IMPLANT
DRAPE U-SHAPE 47X51 STRL (DRAPES) ×3 IMPLANT
DRSG AQUACEL AG ADV 3.5X14 (GAUZE/BANDAGES/DRESSINGS) ×6 IMPLANT
DRSG PAD ABDOMINAL 8X10 ST (GAUZE/BANDAGES/DRESSINGS) ×6 IMPLANT
DURAPREP 26ML APPLICATOR (WOUND CARE) ×6 IMPLANT
ELECT CAUTERY BLADE 6.4 (BLADE) ×3 IMPLANT
ELECT REM PT RETURN 9FT ADLT (ELECTROSURGICAL) ×3
ELECTRODE REM PT RTRN 9FT ADLT (ELECTROSURGICAL) ×1 IMPLANT
EVACUATOR 1/8 PVC DRAIN (DRAIN) ×3 IMPLANT
FACESHIELD WRAPAROUND (MASK) ×3 IMPLANT
FIXATION PIN ×3 IMPLANT
GAUZE SPONGE 4X4 12PLY STRL (GAUZE/BANDAGES/DRESSINGS) ×3 IMPLANT
GLOVE BIO SURGEON STRL SZ7 (GLOVE) ×3 IMPLANT
GLOVE BIOGEL M 6.5 STRL (GLOVE) ×3 IMPLANT
GLOVE BIOGEL PI IND STRL 6.5 (GLOVE) ×1 IMPLANT
GLOVE BIOGEL PI IND STRL 7.0 (GLOVE) ×1 IMPLANT
GLOVE BIOGEL PI IND STRL 7.5 (GLOVE) ×1 IMPLANT
GLOVE BIOGEL PI INDICATOR 6.5 (GLOVE) ×2
GLOVE BIOGEL PI INDICATOR 7.0 (GLOVE) ×2
GLOVE BIOGEL PI INDICATOR 7.5 (GLOVE) ×2
GLOVE BIOGEL PI ORTHO PRO SZ7 (GLOVE) ×2
GLOVE PI ORTHO PRO STRL SZ7 (GLOVE) ×1 IMPLANT
GLOVE SS BIOGEL STRL SZ 7.5 (GLOVE) ×1 IMPLANT
GLOVE SUPERSENSE BIOGEL SZ 7.5 (GLOVE) ×2
GLOVE SURG SS PI 7.0 STRL IVOR (GLOVE) ×6 IMPLANT
GOWN STRL REUS W/ TWL LRG LVL3 (GOWN DISPOSABLE) ×2 IMPLANT
GOWN STRL REUS W/ TWL XL LVL3 (GOWN DISPOSABLE) ×2 IMPLANT
GOWN STRL REUS W/TWL LRG LVL3 (GOWN DISPOSABLE) ×4
GOWN STRL REUS W/TWL XL LVL3 (GOWN DISPOSABLE) ×4
HANDPIECE INTERPULSE COAX TIP (DISPOSABLE) ×2
HOOD PEEL AWAY FACE SHEILD DIS (HOOD) ×6 IMPLANT
IMMOBILIZER KNEE 22 UNIV (SOFTGOODS) ×3 IMPLANT
KIT BASIN OR (CUSTOM PROCEDURE TRAY) ×3 IMPLANT
KIT ROOM TURNOVER OR (KITS) ×3 IMPLANT
MANIFOLD NEPTUNE II (INSTRUMENTS) ×3 IMPLANT
MARKER SKIN DUAL TIP RULER LAB (MISCELLANEOUS) ×3 IMPLANT
NS IRRIG 1000ML POUR BTL (IV SOLUTION) ×3 IMPLANT
PACK TOTAL JOINT (CUSTOM PROCEDURE TRAY) ×3 IMPLANT
PACK UNIVERSAL I (CUSTOM PROCEDURE TRAY) ×3 IMPLANT
PAD ARMBOARD 7.5X6 YLW CONV (MISCELLANEOUS) ×6 IMPLANT
PADDING CAST COTTON 6X4 STRL (CAST SUPPLIES) ×3 IMPLANT
RUBBERBAND STERILE (MISCELLANEOUS) ×3 IMPLANT
SET HNDPC FAN SPRY TIP SCT (DISPOSABLE) ×1 IMPLANT
STRIP CLOSURE SKIN 1/2X4 (GAUZE/BANDAGES/DRESSINGS) ×2 IMPLANT
SUCTION FRAZIER TIP 10 FR DISP (SUCTIONS) ×3 IMPLANT
SUT ETHIBOND NAB CT1 #1 30IN (SUTURE) ×3 IMPLANT
SUT MNCRL AB 3-0 PS2 18 (SUTURE) ×3 IMPLANT
SUT VIC AB 0 CT1 27 (SUTURE) ×4
SUT VIC AB 0 CT1 27XBRD ANBCTR (SUTURE) ×2 IMPLANT
SUT VIC AB 2-0 CT1 27 (SUTURE) ×4
SUT VIC AB 2-0 CT1 TAPERPNT 27 (SUTURE) ×2 IMPLANT
SYR 30ML SLIP (SYRINGE) ×3 IMPLANT
TOWEL OR 17X24 6PK STRL BLUE (TOWEL DISPOSABLE) ×3 IMPLANT
TOWEL OR 17X26 10 PK STRL BLUE (TOWEL DISPOSABLE) ×3 IMPLANT
TRAY FOLEY CATH 16FR SILVER (SET/KITS/TRAYS/PACK) ×3 IMPLANT
WATER STERILE IRR 1000ML POUR (IV SOLUTION) IMPLANT

## 2014-04-02 NOTE — Anesthesia Preprocedure Evaluation (Addendum)
Anesthesia Evaluation  Patient identified by MRN, date of birth, ID band Patient awake    Reviewed: Allergy & Precautions, NPO status , Patient's Chart, lab work & pertinent test results  Airway Mallampati: III  TM Distance: >3 FB Neck ROM: Full    Dental  (+) Partial Upper   Pulmonary neg pulmonary ROS,  breath sounds clear to auscultation- rhonchi  Pulmonary exam normal       Cardiovascular hypertension, Pt. on medications Rhythm:Regular Rate:Normal     Neuro/Psych negative neurological ROS     GI/Hepatic Neg liver ROS, hiatal hernia, GERD-  ,  Endo/Other  negative endocrine ROS  Renal/GU negative Renal ROS     Musculoskeletal  (+) Arthritis -,   Abdominal   Peds  Hematology negative hematology ROS (+)   Anesthesia Other Findings   Reproductive/Obstetrics                            Anesthesia Physical Anesthesia Plan  ASA: II  Anesthesia Plan: Regional and Spinal   Post-op Pain Management:    Induction: Intravenous  Airway Management Planned: Simple Face Mask and Natural Airway  Additional Equipment:   Intra-op Plan:   Post-operative Plan:   Informed Consent: I have reviewed the patients History and Physical, chart, labs and discussed the procedure including the risks, benefits and alternatives for the proposed anesthesia with the patient or authorized representative who has indicated his/her understanding and acceptance.     Plan Discussed with: CRNA  Anesthesia Plan Comments:        Anesthesia Quick Evaluation

## 2014-04-02 NOTE — Progress Notes (Signed)
Orthopedic Tech Progress Note Patient Details:  Ryan Bright 1960/12/28 [id number]  CPM Right Knee CPM Right Knee: On Right Knee Flexion (Degrees): 90 Right Knee Extension (Degrees): 0 Additional Comments: trapeze bar patient helper Viewed order from doctor's order list  Hildred Priest 04/02/2014, 11:03 AM

## 2014-04-02 NOTE — Op Note (Signed)
MRN:     [id number] DOB/AGE:    [birth year] / 54 y.o.       OPERATIVE REPORT    DATE OF PROCEDURE:  04/02/2014       PREOPERATIVE DIAGNOSIS:   PRIMARY LOCALIZED OA RIGHT KNEE      Estimated body mass index is 28.04 kg/(m^2) as calculated from the following:   Height as of this encounter: 6\' 7"  (2.007 m).   Weight as of this encounter: 112.946 kg (249 lb).                                                        POSTOPERATIVE DIAGNOSIS:   SAME                                                                   PROCEDURE:  Procedure(s): RIGHT TOTAL KNEE ARTHROPLASTY Using Depuy Sigma RP implants #5 Femur, #6Tibia, 12.2mm sigma RP bearing, 41 Patella     SURGEON: Morry Veiga A    ASSISTANT:  Kirstin Shepperson PA-C   (Present and scrubbed throughout the case, critical for assistance with exposure, retraction, instrumentation, and closure.)         ANESTHESIA: GET with Femoral Nerve Block  DRAINS: foley, 2 medium hemovac in knee   TOURNIQUET TIME: 13KGM   COMPLICATIONS:  None     SPECIMENS: None   INDICATIONS FOR PROCEDURE: The patient has  OA RIGHT KNEE, varus deformities, XR shows bone on bone arthritis. Patient has failed all conservative measures including anti-inflammatory medicines, narcotics, attempts at  exercise and weight loss, cortisone injections and viscosupplementation.  Risks and benefits of surgery have been discussed, questions answered.   DESCRIPTION OF PROCEDURE: The patient identified by armband, received  right femoral nerve block and IV antibiotics, in the holding area at [facility]. Patient taken to the operating room, appropriate anesthetic  monitors were attached General endotracheal anesthesia induced with  the patient in supine position, Foley catheter was inserted. Tourniquet  applied high to the operative thigh. Lateral post and foot positioner  applied to the table, the lower extremity was then prepped and draped  in usual sterile fashion from the  ankle to the tourniquet. Time-out procedure was performed. The limb was wrapped with an Esmarch bandage and the tourniquet inflated to 365 mmHg. We began the operation by making the anterior midline incision starting at handbreadth above the patella going over the patella 1 cm medial to and  4 cm distal to the tibial tubercle. Small bleeders in the skin and the  subcutaneous tissue identified and cauterized. Transverse retinaculum was incised and reflected medially and a medial parapatellar arthrotomy was accomplished. the patella was everted and theprepatellar fat pad resected. The superficial medial collateral  ligament was then elevated from anterior to posterior along the proximal  flare of the tibia and anterior half of the menisci resected. The knee was hyperflexed exposing bone on bone arthritis. Peripheral and notch osteophytes as well as the cruciate ligaments were then resected. We continued to  work our way around posteriorly along the proximal tibia, and externally  rotated  the tibia subluxing it out from underneath the femur. A McHale  retractor was placed through the notch and a lateral Hohmann retractor  placed, and we then drilled through the proximal tibia in line with the  axis of the tibia followed by an intramedullary guide rod and 2-degree  posterior slope cutting guide. The tibial cutting guide was pinned into place  allowing resection of 8 mm of bone medially and about 2 mm of bone  laterally because of her valgus deformity. Satisfied with the tibial resection, we then  entered the distal femur 2 mm anterior to the PCL origin with the  intramedullary guide rod and applied the distal femoral cutting guide  set at 51mm, with 5 degrees of valgus. This was pinned along the  epicondylar axis. At this point, the distal femoral cut was accomplished without difficulty. We then sized for a #5 femoral component and pinned the guide in 3 degrees of external rotation.The chamfer cutting  guide was pinned into place. The anterior, posterior, and chamfer cuts were accomplished without difficulty followed by  the Sigma RP box cutting guide and the box cut. We also removed posterior osteophytes from the posterior femoral condyles. At this  time, the knee was brought into full extension. We checked our  extension and flexion gaps and found them symmetric at 12.70mm.  The patella thickness measured at 26 mm. We set the cutting guide at 16 and removed the posterior 9.5-10 mm  of the patella sized for 41 button and drilled the lollipop. The knee  was then once again hyperflexed exposing the proximal tibia. We sized for a #6 tibial base plate, applied the smokestack and the conical reamer followed by the the Delta fin keel punch. We then hammered into place the Sigma RP trial femoral component, inserted a 12.5-mm trial bearing, trial patellar button, and took the knee through range of motion from 0-130 degrees. No thumb pressure was required for patellar  tracking. At this point, all trial components were removed, a double batch of DePuy HV cement  was mixed and applied to all bony metallic mating surfaces except for the posterior condyles of the femur itself. In order, we  hammered into place the tibial tray and removed excess cement, the femoral component and removed excess cement, a 12.5-mm Sigma RP bearing  was inserted, and the knee brought to full extension with compression.  The patellar button was clamped into place, and excess cement  removed. While the cement cured the wound was irrigated out with normal saline solution pulse lavage, and medium Hemovac drains were placed.. Ligament stability and patellar tracking were checked and found to be excellent. The tourniquet was then released and hemostasis was obtained with cautery. The parapatellar arthrotomy was closed with  #1 ethibond suture. The subcutaneous tissue with 0 and 2-0 undyed  Vicryl suture, and 4-0 Monocryl.. A dressing of  Xeroform,  4 x 4, dressing sponges, Webril, and Ace wrap applied. Needle and sponge count were correct times 2.The patient awakened, extubated, and taken to recovery room without difficulty. Vascular status was normal, pulses 2+ and symmetric.   Tasheena Wambolt A 04/02/2014, 9:25 AM

## 2014-04-02 NOTE — Anesthesia Procedure Notes (Addendum)
Spinal Patient location during procedure: OR Staffing Anesthesiologist: Suzette Battiest E Performed by: anesthesiologist  Preanesthetic Checklist Completed: patient identified, site marked, surgical consent, pre-op evaluation, timeout performed, IV checked, risks and benefits discussed and monitors and equipment checked Spinal Block Patient position: sitting Prep: site prepped and draped and DuraPrep Patient monitoring: heart rate, continuous pulse ox and blood pressure Approach: midline Location: L5-S1 Injection technique: single-shot Needle Needle type: Spinocan  Needle gauge: 22 G Needle length: 9 cm Additional Notes Expiration date of kit checked and confirmed. Patient tolerated procedure well, without complications.    Procedure Name: MAC Date/Time: 04/02/2014 7:27 AM Performed by: Susa Loffler Pre-anesthesia Checklist: Patient identified, Timeout performed, Emergency Drugs available, Suction available and Patient being monitored Patient Re-evaluated:Patient Re-evaluated prior to inductionOxygen Delivery Method: Nasal cannula Dental Injury: Teeth and Oropharynx as per pre-operative assessment     Anesthesia Regional Block:  Adductor canal block  Pre-Anesthetic Checklist: ,, timeout performed, Correct Patient, Correct Site, Correct Laterality, Correct Procedure, Correct Position, site marked, Risks and benefits discussed,  Surgical consent,  Pre-op evaluation,  At surgeon's request and post-op pain management  Laterality: Right  Prep: chloraprep       Needles:  Injection technique: Single-shot  Needle Type: Echogenic Needle     Needle Length: 9cm 9 cm Needle Gauge: 21 and 21 G    Additional Needles:  Procedures: ultrasound guided (picture in chart) Adductor canal block Narrative:  Start time: 04/02/2014 6:55 AM End time: 04/02/2014 7:05 AM Injection made incrementally with aspirations every 5 mL.  Performed by: Personally  Anesthesiologist:  Suzette Battiest E  Additional Notes: Risks and benefits explained to pt. Pt tolerated procedure well with no immediate complications.

## 2014-04-02 NOTE — Progress Notes (Signed)
Utilization review completed.  

## 2014-04-02 NOTE — Evaluation (Signed)
Physical Therapy Evaluation Patient Details Name: Ryan Bright MRN: [id number] DOB: 15-Feb-1961 Today's Date: 04/02/2014   History of Present Illness  Pt s/p R TKA  Clinical Impression  Pt is s/p TKA resulting in the deficits listed below (see PT Problem List).  Pt will benefit from skilled PT to increase their independence and safety with mobility to allow discharge to the venue listed below.      Follow Up Recommendations Home health PT    Equipment Recommendations  None recommended by PT    Recommendations for Other Services       Precautions / Restrictions Precautions Precautions: Knee Required Braces or Orthoses: Knee Immobilizer - Right Knee Immobilizer - Right: On except when in CPM;Discontinue once straight leg raise with < 10 degree lag Restrictions Weight Bearing Restrictions: Yes RLE Weight Bearing: Weight bearing as tolerated      Mobility  Bed Mobility Overal bed mobility: Needs Assistance Bed Mobility: Supine to Sit     Supine to sit: Supervision;HOB elevated        Transfers Overall transfer level: Needs assistance Equipment used: Rolling walker (2 wheeled) Transfers: Sit to/from Stand Sit to Stand: Min guard;From elevated surface         General transfer comment: min cues for correct technique  Ambulation/Gait Ambulation/Gait assistance: Min assist Ambulation Distance (Feet): 20 Feet Assistive device: Rolling walker (2 wheeled) Gait Pattern/deviations: Step-to pattern;Decreased stance time - right (decreased weight bearing on R)   Gait velocity interpretation: Below normal speed for age/gender General Gait Details: min cues for posture and sequencing  Stairs            Wheelchair Mobility    Modified Rankin (Stroke Patients Only)       Balance Overall balance assessment: Needs assistance Sitting-balance support: No upper extremity supported;Feet supported Sitting balance-Leahy Scale: Fair     Standing balance support:  Bilateral upper extremity supported;During functional activity Standing balance-Leahy Scale: Fair                               Pertinent Vitals/Pain Pain Assessment: 0-10 Pain Score: 5  Pain Location: R heel in CPM Pain Intervention(s): Repositioned    Home Living Family/patient expects to be discharged to:: Private residence Living Arrangements: Spouse/significant other Available Help at Discharge: Family;Available 24 hours/day Type of Home: House Home Access: Stairs to enter Entrance Stairs-Rails: Right;Left;Can reach both Entrance Stairs-Number of Steps: 3 Home Layout: One level Home Equipment: Walker - 2 wheels;Crutches      Prior Function Level of Independence: Independent               Hand Dominance        Extremity/Trunk Assessment               Lower Extremity Assessment: Generalized weakness         Communication   Communication: No difficulties  Cognition Arousal/Alertness: Awake/alert                          General Comments General comments (skin integrity, edema, etc.): pt demonstrates ~ 15 degree extensor lag with SLR    Exercises Total Joint Exercises Ankle Circles/Pumps: Both;10 reps Quad Sets: Right;10 reps Straight Leg Raises: Right;10 reps;AAROM      Assessment/Plan    PT Assessment Patient needs continued PT services  PT Diagnosis Difficulty walking;Generalized weakness;Abnormality of gait;Acute pain   PT Problem List Decreased strength;Decreased  range of motion;Decreased activity tolerance;Decreased balance;Decreased mobility;Decreased coordination;Decreased knowledge of use of DME;Decreased safety awareness;Decreased knowledge of precautions;Pain  PT Treatment Interventions DME instruction;Gait training;Stair training;Functional mobility training;Therapeutic exercise;Therapeutic activities;Balance training;Neuromuscular re-education;Patient/family education   PT Goals (Current goals can be found in  the Care Plan section) Acute Rehab PT Goals Patient Stated Goal: to go home PT Goal Formulation: With patient/family Time For Goal Achievement: 04/09/14 Potential to Achieve Goals: Good    Frequency 7X/week   Barriers to discharge        Co-evaluation               End of Session Equipment Utilized During Treatment: Gait belt;Right knee immobilizer Activity Tolerance: Patient tolerated treatment well;Patient limited by fatigue Patient left: in chair;with call bell/phone within reach;with family/visitor present Nurse Communication: Mobility status         Time: 1941-7408 PT Time Calculation (min) (ACUTE ONLY): 38 min   Charges:   PT Evaluation $Initial PT Evaluation Tier I: 1 Procedure PT Treatments $Gait Training: 8-22 mins $Therapeutic Exercise: 8-22 mins   PT G Codes:       Laureen Abrahams, PT, DPT 04/02/2014 4:05 PM [phone]

## 2014-04-02 NOTE — Discharge Instructions (Signed)
Information on my medicine - ELIQUIS (apixaban)  This medication education was reviewed with me or my healthcare representative as part of my discharge preparation.    Why was Eliquis prescribed for you? Eliquis was prescribed for you to reduce the risk of blood clots forming after orthopedic surgery.    What do You need to know about Eliquis? Take your Eliquis 2.5 mg TWICE DAILY - one tablet in the morning and one tablet in the evening with or without food.  It would be best to take the dose about the same time each day.  If you have difficulty swallowing the tablet whole please discuss with your pharmacist how to take the medication safely.  Take Eliquis exactly as prescribed by your doctor and DO NOT stop taking Eliquis without talking to the doctor who prescribed the medication.  Stopping without other medication to take the place of Eliquis may increase your risk of developing a clot.  After discharge, you should have regular check-up appointments with your healthcare provider that is prescribing your Eliquis.  What do you do if you miss a dose? If a dose of ELIQUIS is not taken at the scheduled time, take it as soon as possible on the same day and twice-daily administration should be resumed.  The dose should not be doubled to make up for a missed dose.  Do not take more than one tablet of ELIQUIS at the same time.  Important Safety Information A possible side effect of Eliquis is bleeding. You should call your healthcare provider right away if you experience any of the following: ? Bleeding from an injury or your nose that does not stop. ? Unusual colored urine (red or dark brown) or unusual colored stools (red or black). ? Unusual bruising for unknown reasons. ? A serious fall or if you hit your head (even if there is no bleeding).  Some medicines may interact with Eliquis and might increase your risk of bleeding or clotting while on Eliquis. To help avoid this, consult  your healthcare provider or pharmacist prior to using any new prescription or non-prescription medications, including herbals, vitamins, non-steroidal anti-inflammatory drugs (NSAIDs) and supplements.  This website has more information on Eliquis (apixaban): http://www.eliquis.com/eliquis/home

## 2014-04-02 NOTE — Interval H&P Note (Signed)
History and Physical Interval Note:  04/02/2014 7:04 AM  Ryan Bright  has presented today for surgery, with the diagnosis of OA RIGHT KNEE  The various methods of treatment have been discussed with the patient and family. After consideration of risks, benefits and other options for treatment, the patient has consented to  Procedure(s): RIGHT TOTAL KNEE ARTHROPLASTY (Right) as a surgical intervention .  The patient's history has been reviewed, patient examined, no change in status, stable for surgery.  I have reviewed the patient's chart and labs.  Questions were answered to the patient's satisfaction.     [name]

## 2014-04-02 NOTE — Transfer of Care (Signed)
Immediate Anesthesia Transfer of Care Note  Patient: Ryan Bright  Procedure(s) Performed: Procedure(s): RIGHT TOTAL KNEE ARTHROPLASTY (Right)  Patient Location: PACU  Anesthesia Type:Spinal  Level of Consciousness: awake and alert   Airway & Oxygen Therapy: Patient Spontanous Breathing and Patient connected to nasal cannula oxygen  Post-op Assessment: Report given to RN and Post -op Vital signs reviewed and stable  Post vital signs: Reviewed and stable  Last Vitals:  Filed Vitals:   04/02/14 0554  BP: 115/82  Pulse: 69  Temp: 28.7 C    Complications: No apparent anesthesia complications

## 2014-04-03 ENCOUNTER — Encounter (HOSPITAL_COMMUNITY): Payer: Self-pay | Admitting: Orthopedic Surgery

## 2014-04-03 DIAGNOSIS — Z96651 Presence of right artificial knee joint: Secondary | ICD-10-CM | POA: Diagnosis not present

## 2014-04-03 LAB — BASIC METABOLIC PANEL
Anion gap: 8 (ref 5–15)
BUN: 13 mg/dL (ref 6–23)
CALCIUM: 8.8 mg/dL (ref 8.4–10.5)
CO2: 25 mmol/L (ref 19–32)
Chloride: 102 mmol/L (ref 96–112)
Creatinine, Ser: 0.99 mg/dL (ref 0.50–1.35)
Glucose, Bld: 267 mg/dL — ABNORMAL HIGH (ref 70–99)
Potassium: 4.3 mmol/L (ref 3.5–5.1)
Sodium: 135 mmol/L (ref 135–145)

## 2014-04-03 LAB — CBC
HCT: 29.5 % — ABNORMAL LOW (ref 39.0–52.0)
HEMOGLOBIN: 10.1 g/dL — AB (ref 13.0–17.0)
MCH: 29 pg (ref 26.0–34.0)
MCHC: 34.2 g/dL (ref 30.0–36.0)
MCV: 84.8 fL (ref 78.0–100.0)
Platelets: 247 10*3/uL (ref 150–400)
RBC: 3.48 MIL/uL — ABNORMAL LOW (ref 4.22–5.81)
RDW: 13.3 % (ref 11.5–15.5)
WBC: 13.5 10*3/uL — ABNORMAL HIGH (ref 4.0–10.5)

## 2014-04-03 MED ORDER — OXYCODONE HCL 5 MG PO TABS: ORAL_TABLET | ORAL | Status: DC

## 2014-04-03 MED ORDER — CELECOXIB 200 MG PO CAPS
200.0000 mg | ORAL_CAPSULE | Freq: Every day | ORAL | Status: DC
  Administered 2014-04-03: 200 mg via ORAL
  Filled 2014-04-03: qty 1

## 2014-04-03 MED ORDER — POLYETHYLENE GLYCOL 3350 17 G PO PACK: 17.0000 g | PACK | Freq: Two times a day (BID) | ORAL | Status: DC

## 2014-04-03 MED ORDER — DOCUSATE SODIUM 100 MG PO CAPS: 100.0000 mg | ORAL_CAPSULE | Freq: Two times a day (BID) | ORAL | Status: AC

## 2014-04-03 MED ORDER — CELECOXIB 200 MG PO CAPS: ORAL_CAPSULE | ORAL | Status: DC

## 2014-04-03 MED ORDER — APIXABAN 2.5 MG PO TABS: ORAL_TABLET | ORAL | Status: DC
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
## 2014-04-03 NOTE — Progress Notes (Signed)
Physical Therapy Treatment Patient Details Name: Ryan Bright MRN: [id number] DOB: [birth year] Today's Date: 04/03/2014    History of Present Illness Pt s/p R TKA    PT Comments    Used KI for amb this session, and pt reported he feels more confident using it; Questions answered; OK for dc home from PT standpoint   Follow Up Recommendations  Home health PT     Equipment Recommendations  None recommended by PT    Recommendations for Other Services       Precautions / Restrictions Precautions Precautions: Knee Precaution Comments: Pt educated to not allow any pillow or bolster under knee for healing with optimal range of motion.  Required Braces or Orthoses: Knee Immobilizer - Right Knee Immobilizer - Right: On except when in CPM;Discontinue once straight leg raise with < 10 degree lag Restrictions RLE Weight Bearing: Weight bearing as tolerated    Mobility  Bed Mobility Overal bed mobility: Needs Assistance Bed Mobility: Supine to Sit;Sit to Supine     Supine to sit: Modified independent (Device/Increase time) Sit to supine: Modified independent (Device/Increase time)   General bed mobility comments: used bedrail effectively; practiced with bed height adjusted to match pt's bed at home  Transfers Overall transfer level: Needs assistance Equipment used: Rolling walker (2 wheeled) Transfers: Sit to/from Stand Sit to Stand: Supervision         General transfer comment: cues for technique; noted decr control of stand to sit to bed  Ambulation/Gait Ambulation/Gait assistance: Min guard Ambulation Distance (Feet): 120 Feet Assistive device: Rolling walker (2 wheeled) Gait Pattern/deviations: Step-through pattern     General Gait Details: min cues for posture,  sequencing, and step length with respect to RW proximity;   Stairs            Wheelchair Mobility    Modified Rankin (Stroke Patients Only)       Balance             Standing  balance-Leahy Scale: Poor                      Cognition Arousal/Alertness: Awake/alert Behavior During Therapy: WFL for tasks assessed/performed Overall Cognitive Status: Within Functional Limits for tasks assessed                      Exercises Total Joint Exercises Ankle Circles/Pumps: Both;10 reps Quad Sets: Right;10 reps Short Arc Quad: AROM;Right;10 reps Heel Slides: AROM;Right;10 reps Straight Leg Raises: AAROM;AROM;Right;10 reps Goniometric ROM: approx 0-68 deg    General Comments        Pertinent Vitals/Pain Pain Assessment: 0-10 Pain Score: 2  Pain Location: R knee Pain Descriptors / Indicators: Aching Pain Intervention(s): Monitored during session    Home Living                      Prior Function            PT Goals (current goals can now be found in the care plan section) Acute Rehab PT Goals Patient Stated Goal: to go home PT Goal Formulation: With patient/family Time For Goal Achievement: 04/09/14 Potential to Achieve Goals: Good Progress towards PT goals: Progressing toward goals    Frequency  7X/week    PT Plan Current plan remains appropriate    Co-evaluation             End of Session Equipment Utilized During Treatment: Gait belt Activity Tolerance: Patient tolerated treatment well  Patient left: in bed;with call bell/phone within reach;with family/visitor present     Time: 1359-1420 PT Time Calculation (min) (ACUTE ONLY): 21 min  Charges:  $Gait Training: 8-22 mins                    G Codes:      [name] 04/03/2014, 3:14 PM  [name], [name] Pager [phone] Office [phone]

## 2014-04-03 NOTE — Evaluation (Signed)
Occupational Therapy Evaluation Patient Details Name: Ryan Bright MRN: [id number] DOB: [birth year] Today's Date: 04/03/2014    History of Present Illness Pt s/p R TKA   Clinical Impression   Pt admitted with the above diagnoses and presents with below problem list. Pt will benefit from continued acute OT to address the below listed deficits and maximize independence with basic ADLs prior to d/c home with family. PTA pt was independent with ADLs. Pt currently at min guard level for LB ADLs using AE.      Follow Up Recommendations  Supervision/Assistance - 24 hour;No OT follow up    Equipment Recommendations  None recommended by OT;Other (comment) (pt has recommended DME)    Recommendations for Other Services       Precautions / Restrictions Precautions Precautions: Knee Precaution Comments: reviewed precautions as related to ADLs Required Braces or Orthoses: Knee Immobilizer - Right Knee Immobilizer - Right: On except when in CPM;Discontinue once straight leg raise with < 10 degree lag Restrictions Weight Bearing Restrictions: Yes RLE Weight Bearing: Weight bearing as tolerated      Mobility Bed Mobility               General bed mobility comments: in recliner  Transfers Overall transfer level: Needs assistance Equipment used: Rolling walker (2 wheeled) Transfers: Sit to/from Stand Sit to Stand: Min guard         General transfer comment: cues for technique    Balance Overall balance assessment: Needs assistance Sitting-balance support: No upper extremity supported;Feet supported Sitting balance-Leahy Scale: Fair     Standing balance support: Bilateral upper extremity supported;During functional activity Standing balance-Leahy Scale: Poor Standing balance comment: relies on rw for balance                            ADL Overall ADL's : Needs assistance/impaired Eating/Feeding: Set up;Sitting   Grooming: Set up;Sitting   Upper Body  Bathing: Set up;Sitting   Lower Body Bathing: Min guard;Sit to/from stand   Upper Body Dressing : Set up;Sitting   Lower Body Dressing: Min guard;Sit to/from stand   Toilet Transfer: Min guard;Ambulation;RW (3n1 over toilet)   Toileting- Clothing Manipulation and Hygiene: Min guard;Sit to/from stand   Tub/ Shower Transfer: Min guard;Ambulation;3 in 1;Rolling walker   Functional mobility during ADLs: Min guard;Rolling walker General ADL Comments: Pt completed in-room ambulation, transfer to comfort height toilet with grab bars, and transfer to 3n1 at min guard level with cues for technique. Educated on techniques and AE for ADL completion and safety at home (footwear, bag on rw, remove throw rugs, etc.)     Vision     Perception     Praxis      Pertinent Vitals/Pain Pain Assessment: 0-10 Pain Score: 3  Pain Location: R knee  Pain Descriptors / Indicators: Aching Pain Intervention(s): Monitored during session     Hand Dominance     Extremity/Trunk Assessment Upper Extremity Assessment Upper Extremity Assessment: Overall WFL for tasks assessed   Lower Extremity Assessment Lower Extremity Assessment: Defer to PT evaluation       Communication Communication Communication: No difficulties   Cognition Arousal/Alertness: Awake/alert Behavior During Therapy: WFL for tasks assessed/performed Overall Cognitive Status: Within Functional Limits for tasks assessed                     General Comments       Exercises      Shoulder Instructions  Home Living Family/patient expects to be discharged to:: Private residence Living Arrangements: Spouse/significant other Available Help at Discharge: Family;Available 24 hours/day Type of Home: House Home Access: Stairs to enter [employer] of Steps: 3 Entrance Stairs-Rails: Right;Left;Can reach both Home Layout: One level     Bathroom Shower/Tub: Occupational psychologist: Standard      Home Equipment: Environmental consultant - 2 wheels;Crutches;Bedside commode;Shower seat - built in;Grab bars - tub/shower;Hand held shower head;Adaptive equipment Adaptive Equipment: Reacher;Long-handled shoe horn        Prior Functioning/Environment Level of Independence: Independent             OT Diagnosis: Acute pain   OT Problem List: Impaired balance (sitting and/or standing);Decreased knowledge of use of DME or AE;Decreased knowledge of precautions;Pain   OT Treatment/Interventions: Self-care/ADL training;DME and/or AE instruction;Therapeutic activities;Patient/family education;Balance training    OT Goals(Current goals can be found in the care plan section) Acute Rehab OT Goals Patient Stated Goal: not stated OT Goal Formulation: With patient Time For Goal Achievement: 04/10/14 Potential to Achieve Goals: Good ADL Goals Pt Will Perform Lower Body Dressing: with modified independence;with adaptive equipment;sit to/from stand Pt Will Transfer to Toilet: with modified independence;ambulating (3n1 over toilet) Pt Will Perform Toileting - Clothing Manipulation and hygiene: with modified independence;sit to/from stand Pt Will Perform Tub/Shower Transfer: with modified independence;ambulating;3 in 1;shower seat;rolling walker  OT Frequency: Min 2X/week   Barriers to D/C:            Co-evaluation              End of Session Equipment Utilized During Treatment: Gait belt;Rolling walker;Other (comment) (per pt as of this morning he does not need right KI to ambulate in room) CPM Right Knee Additional Comments: zero knee on  Activity Tolerance: Patient tolerated treatment well Patient left: in chair;with call bell/phone within reach;with family/visitor present   Time: 9381-0175 OT Time Calculation (min): 21 min Charges:  OT General Charges $OT Visit: 1 Procedure OT Evaluation $Initial OT Evaluation Tier I: 1 Procedure G-Codes:    Hortencia Pilar 25-Apr-2014, 10:13 AM

## 2014-04-04 DIAGNOSIS — I10 Essential (primary) hypertension: Secondary | ICD-10-CM | POA: Diagnosis not present

## 2014-04-04 DIAGNOSIS — Z96651 Presence of right artificial knee joint: Secondary | ICD-10-CM | POA: Diagnosis not present

## 2014-04-04 DIAGNOSIS — Z471 Aftercare following joint replacement surgery: Secondary | ICD-10-CM | POA: Diagnosis not present

## 2014-04-04 DIAGNOSIS — K219 Gastro-esophageal reflux disease without esophagitis: Secondary | ICD-10-CM | POA: Diagnosis not present

## 2014-04-04 NOTE — Anesthesia Postprocedure Evaluation (Signed)
  Anesthesia Post-op Note  Patient: Ryan Bright  Procedure(s) Performed: Procedure(s): RIGHT TOTAL KNEE ARTHROPLASTY (Right)  Patient Location: PACU  Anesthesia Type:General  Level of Consciousness: awake and alert   Airway and Oxygen Therapy: Patient Spontanous Breathing  Post-op Pain: none  Post-op Assessment: Post-op Vital signs reviewed  Post-op Vital Signs: Reviewed  Last Vitals:  Filed Vitals:   04/03/14 0528  BP: 130/84  Pulse: 97  Temp: 36.6 C  Resp: 17    Complications: No apparent anesthesia complications

## 2014-04-04 NOTE — Progress Notes (Signed)
04/04/14 Set up with [facility] for HHPT by MD office. Informed by Stanton Kidney with [facility] that they do not work with patient's insurance. Spoke with patient on 04/03/14,he selected [facility] for [place]. Contacted Miranda at [facility] nad set up [place]. Spoke with Ruby Cola from [facility], they are providing CPM, rolling walker and 3N1. Patient stated that he will have assistance after discharge.

## 2014-04-06 DIAGNOSIS — Z96651 Presence of right artificial knee joint: Secondary | ICD-10-CM | POA: Diagnosis not present

## 2014-04-06 DIAGNOSIS — I10 Essential (primary) hypertension: Secondary | ICD-10-CM | POA: Diagnosis not present

## 2014-04-06 DIAGNOSIS — K219 Gastro-esophageal reflux disease without esophagitis: Secondary | ICD-10-CM | POA: Diagnosis not present

## 2014-04-06 DIAGNOSIS — Z471 Aftercare following joint replacement surgery: Secondary | ICD-10-CM | POA: Diagnosis not present

## 2014-04-09 ENCOUNTER — Other Ambulatory Visit (HOSPITAL_COMMUNITY): Payer: Self-pay | Admitting: Cardiology

## 2014-04-09 ENCOUNTER — Ambulatory Visit (HOSPITAL_COMMUNITY): Payer: Commercial Managed Care - HMO | Attending: Interventional Cardiology | Admitting: Cardiology

## 2014-04-09 DIAGNOSIS — M7989 Other specified soft tissue disorders: Secondary | ICD-10-CM | POA: Insufficient documentation

## 2014-04-09 DIAGNOSIS — M79604 Pain in right leg: Secondary | ICD-10-CM

## 2014-04-09 NOTE — Progress Notes (Signed)
Right lower venous duplex performed  

## 2014-04-10 DIAGNOSIS — K219 Gastro-esophageal reflux disease without esophagitis: Secondary | ICD-10-CM | POA: Diagnosis not present

## 2014-04-10 DIAGNOSIS — I10 Essential (primary) hypertension: Secondary | ICD-10-CM | POA: Diagnosis not present

## 2014-04-10 DIAGNOSIS — Z96651 Presence of right artificial knee joint: Secondary | ICD-10-CM | POA: Diagnosis not present

## 2014-04-10 DIAGNOSIS — Z471 Aftercare following joint replacement surgery: Secondary | ICD-10-CM | POA: Diagnosis not present

## 2014-04-10 DIAGNOSIS — Z0271 Encounter for disability determination: Secondary | ICD-10-CM

## 2014-04-12 DIAGNOSIS — Z471 Aftercare following joint replacement surgery: Secondary | ICD-10-CM | POA: Diagnosis not present

## 2014-04-12 DIAGNOSIS — K219 Gastro-esophageal reflux disease without esophagitis: Secondary | ICD-10-CM | POA: Diagnosis not present

## 2014-04-12 DIAGNOSIS — M25571 Pain in right ankle and joints of right foot: Secondary | ICD-10-CM | POA: Diagnosis not present

## 2014-04-12 DIAGNOSIS — Z96651 Presence of right artificial knee joint: Secondary | ICD-10-CM | POA: Diagnosis not present

## 2014-04-12 DIAGNOSIS — I10 Essential (primary) hypertension: Secondary | ICD-10-CM | POA: Diagnosis not present

## 2014-04-13 DIAGNOSIS — K219 Gastro-esophageal reflux disease without esophagitis: Secondary | ICD-10-CM | POA: Diagnosis not present

## 2014-04-13 DIAGNOSIS — Z471 Aftercare following joint replacement surgery: Secondary | ICD-10-CM | POA: Diagnosis not present

## 2014-04-13 DIAGNOSIS — Z96651 Presence of right artificial knee joint: Secondary | ICD-10-CM | POA: Diagnosis not present

## 2014-04-13 DIAGNOSIS — I10 Essential (primary) hypertension: Secondary | ICD-10-CM | POA: Diagnosis not present

## 2014-04-16 DIAGNOSIS — K219 Gastro-esophageal reflux disease without esophagitis: Secondary | ICD-10-CM | POA: Diagnosis not present

## 2014-04-16 DIAGNOSIS — Z471 Aftercare following joint replacement surgery: Secondary | ICD-10-CM | POA: Diagnosis not present

## 2014-04-16 DIAGNOSIS — I10 Essential (primary) hypertension: Secondary | ICD-10-CM | POA: Diagnosis not present

## 2014-04-16 DIAGNOSIS — Z96651 Presence of right artificial knee joint: Secondary | ICD-10-CM | POA: Diagnosis not present

## 2014-04-18 DIAGNOSIS — K219 Gastro-esophageal reflux disease without esophagitis: Secondary | ICD-10-CM | POA: Diagnosis not present

## 2014-04-18 DIAGNOSIS — Z471 Aftercare following joint replacement surgery: Secondary | ICD-10-CM | POA: Diagnosis not present

## 2014-04-18 DIAGNOSIS — Z96651 Presence of right artificial knee joint: Secondary | ICD-10-CM | POA: Diagnosis not present

## 2014-04-18 DIAGNOSIS — I10 Essential (primary) hypertension: Secondary | ICD-10-CM | POA: Diagnosis not present

## 2014-04-19 DIAGNOSIS — M25561 Pain in right knee: Secondary | ICD-10-CM | POA: Diagnosis not present

## 2014-04-19 DIAGNOSIS — R269 Unspecified abnormalities of gait and mobility: Secondary | ICD-10-CM | POA: Diagnosis not present

## 2014-04-19 DIAGNOSIS — M6281 Muscle weakness (generalized): Secondary | ICD-10-CM | POA: Diagnosis not present

## 2014-04-19 DIAGNOSIS — Z96651 Presence of right artificial knee joint: Secondary | ICD-10-CM | POA: Diagnosis not present

## 2014-04-19 DIAGNOSIS — M25661 Stiffness of right knee, not elsewhere classified: Secondary | ICD-10-CM | POA: Diagnosis not present

## 2014-04-20 DIAGNOSIS — R269 Unspecified abnormalities of gait and mobility: Secondary | ICD-10-CM | POA: Diagnosis not present

## 2014-04-20 DIAGNOSIS — M25661 Stiffness of right knee, not elsewhere classified: Secondary | ICD-10-CM | POA: Diagnosis not present

## 2014-04-20 DIAGNOSIS — Z96651 Presence of right artificial knee joint: Secondary | ICD-10-CM | POA: Diagnosis not present

## 2014-04-20 DIAGNOSIS — M6281 Muscle weakness (generalized): Secondary | ICD-10-CM | POA: Diagnosis not present

## 2014-04-20 DIAGNOSIS — M25561 Pain in right knee: Secondary | ICD-10-CM | POA: Diagnosis not present

## 2014-04-24 DIAGNOSIS — M6281 Muscle weakness (generalized): Secondary | ICD-10-CM | POA: Diagnosis not present

## 2014-04-24 DIAGNOSIS — M25561 Pain in right knee: Secondary | ICD-10-CM | POA: Diagnosis not present

## 2014-04-24 DIAGNOSIS — M25661 Stiffness of right knee, not elsewhere classified: Secondary | ICD-10-CM | POA: Diagnosis not present

## 2014-04-24 DIAGNOSIS — Z96651 Presence of right artificial knee joint: Secondary | ICD-10-CM | POA: Diagnosis not present

## 2014-04-24 DIAGNOSIS — R269 Unspecified abnormalities of gait and mobility: Secondary | ICD-10-CM | POA: Diagnosis not present

## 2014-04-26 DIAGNOSIS — M25561 Pain in right knee: Secondary | ICD-10-CM | POA: Diagnosis not present

## 2014-04-26 DIAGNOSIS — M6281 Muscle weakness (generalized): Secondary | ICD-10-CM | POA: Diagnosis not present

## 2014-04-26 DIAGNOSIS — R269 Unspecified abnormalities of gait and mobility: Secondary | ICD-10-CM | POA: Diagnosis not present

## 2014-04-26 DIAGNOSIS — M25661 Stiffness of right knee, not elsewhere classified: Secondary | ICD-10-CM | POA: Diagnosis not present

## 2014-04-30 DIAGNOSIS — Z96651 Presence of right artificial knee joint: Secondary | ICD-10-CM | POA: Diagnosis not present

## 2014-05-01 DIAGNOSIS — M25561 Pain in right knee: Secondary | ICD-10-CM | POA: Diagnosis not present

## 2014-05-01 DIAGNOSIS — M6281 Muscle weakness (generalized): Secondary | ICD-10-CM | POA: Diagnosis not present

## 2014-05-01 DIAGNOSIS — M25661 Stiffness of right knee, not elsewhere classified: Secondary | ICD-10-CM | POA: Diagnosis not present

## 2014-05-01 DIAGNOSIS — R269 Unspecified abnormalities of gait and mobility: Secondary | ICD-10-CM | POA: Diagnosis not present

## 2014-05-03 DIAGNOSIS — R269 Unspecified abnormalities of gait and mobility: Secondary | ICD-10-CM | POA: Diagnosis not present

## 2014-05-03 DIAGNOSIS — M25661 Stiffness of right knee, not elsewhere classified: Secondary | ICD-10-CM | POA: Diagnosis not present

## 2014-05-03 DIAGNOSIS — M6281 Muscle weakness (generalized): Secondary | ICD-10-CM | POA: Diagnosis not present

## 2014-05-03 DIAGNOSIS — M25561 Pain in right knee: Secondary | ICD-10-CM | POA: Diagnosis not present

## 2014-05-08 DIAGNOSIS — M25561 Pain in right knee: Secondary | ICD-10-CM | POA: Diagnosis not present

## 2014-05-08 DIAGNOSIS — M25661 Stiffness of right knee, not elsewhere classified: Secondary | ICD-10-CM | POA: Diagnosis not present

## 2014-05-08 DIAGNOSIS — R269 Unspecified abnormalities of gait and mobility: Secondary | ICD-10-CM | POA: Diagnosis not present

## 2014-05-08 DIAGNOSIS — M6281 Muscle weakness (generalized): Secondary | ICD-10-CM | POA: Diagnosis not present

## 2014-05-10 DIAGNOSIS — M25561 Pain in right knee: Secondary | ICD-10-CM | POA: Diagnosis not present

## 2014-05-10 DIAGNOSIS — R269 Unspecified abnormalities of gait and mobility: Secondary | ICD-10-CM | POA: Diagnosis not present

## 2014-05-10 DIAGNOSIS — M25661 Stiffness of right knee, not elsewhere classified: Secondary | ICD-10-CM | POA: Diagnosis not present

## 2014-05-10 DIAGNOSIS — M6281 Muscle weakness (generalized): Secondary | ICD-10-CM | POA: Diagnosis not present

## 2014-05-15 DIAGNOSIS — M25561 Pain in right knee: Secondary | ICD-10-CM | POA: Diagnosis not present

## 2014-05-15 DIAGNOSIS — M6281 Muscle weakness (generalized): Secondary | ICD-10-CM | POA: Diagnosis not present

## 2014-05-15 DIAGNOSIS — M25661 Stiffness of right knee, not elsewhere classified: Secondary | ICD-10-CM | POA: Diagnosis not present

## 2014-05-15 DIAGNOSIS — R269 Unspecified abnormalities of gait and mobility: Secondary | ICD-10-CM | POA: Diagnosis not present

## 2014-05-17 DIAGNOSIS — M25661 Stiffness of right knee, not elsewhere classified: Secondary | ICD-10-CM | POA: Diagnosis not present

## 2014-05-17 DIAGNOSIS — M25561 Pain in right knee: Secondary | ICD-10-CM | POA: Diagnosis not present

## 2014-05-17 DIAGNOSIS — M6281 Muscle weakness (generalized): Secondary | ICD-10-CM | POA: Diagnosis not present

## 2014-05-17 DIAGNOSIS — R269 Unspecified abnormalities of gait and mobility: Secondary | ICD-10-CM | POA: Diagnosis not present

## 2014-05-22 DIAGNOSIS — M25561 Pain in right knee: Secondary | ICD-10-CM | POA: Diagnosis not present

## 2014-05-22 DIAGNOSIS — M25661 Stiffness of right knee, not elsewhere classified: Secondary | ICD-10-CM | POA: Diagnosis not present

## 2014-05-22 DIAGNOSIS — M6281 Muscle weakness (generalized): Secondary | ICD-10-CM | POA: Diagnosis not present

## 2014-05-22 DIAGNOSIS — R269 Unspecified abnormalities of gait and mobility: Secondary | ICD-10-CM | POA: Diagnosis not present

## 2014-05-24 DIAGNOSIS — M6281 Muscle weakness (generalized): Secondary | ICD-10-CM | POA: Diagnosis not present

## 2014-05-24 DIAGNOSIS — M25661 Stiffness of right knee, not elsewhere classified: Secondary | ICD-10-CM | POA: Diagnosis not present

## 2014-05-24 DIAGNOSIS — M25561 Pain in right knee: Secondary | ICD-10-CM | POA: Diagnosis not present

## 2014-05-24 DIAGNOSIS — R269 Unspecified abnormalities of gait and mobility: Secondary | ICD-10-CM | POA: Diagnosis not present

## 2014-05-28 DIAGNOSIS — Z96651 Presence of right artificial knee joint: Secondary | ICD-10-CM | POA: Diagnosis not present

## 2014-05-29 DIAGNOSIS — M6281 Muscle weakness (generalized): Secondary | ICD-10-CM | POA: Diagnosis not present

## 2014-05-29 DIAGNOSIS — R269 Unspecified abnormalities of gait and mobility: Secondary | ICD-10-CM | POA: Diagnosis not present

## 2014-05-29 DIAGNOSIS — M25561 Pain in right knee: Secondary | ICD-10-CM | POA: Diagnosis not present

## 2014-05-29 DIAGNOSIS — M25661 Stiffness of right knee, not elsewhere classified: Secondary | ICD-10-CM | POA: Diagnosis not present

## 2014-05-31 DIAGNOSIS — M6281 Muscle weakness (generalized): Secondary | ICD-10-CM | POA: Diagnosis not present

## 2014-05-31 DIAGNOSIS — R269 Unspecified abnormalities of gait and mobility: Secondary | ICD-10-CM | POA: Diagnosis not present

## 2014-05-31 DIAGNOSIS — M25561 Pain in right knee: Secondary | ICD-10-CM | POA: Diagnosis not present

## 2014-05-31 DIAGNOSIS — M25661 Stiffness of right knee, not elsewhere classified: Secondary | ICD-10-CM | POA: Diagnosis not present

## 2014-06-04 DIAGNOSIS — M6281 Muscle weakness (generalized): Secondary | ICD-10-CM | POA: Diagnosis not present

## 2014-06-04 DIAGNOSIS — R269 Unspecified abnormalities of gait and mobility: Secondary | ICD-10-CM | POA: Diagnosis not present

## 2014-06-04 DIAGNOSIS — M25561 Pain in right knee: Secondary | ICD-10-CM | POA: Diagnosis not present

## 2014-06-04 DIAGNOSIS — M25661 Stiffness of right knee, not elsewhere classified: Secondary | ICD-10-CM | POA: Diagnosis not present

## 2014-06-07 DIAGNOSIS — M25661 Stiffness of right knee, not elsewhere classified: Secondary | ICD-10-CM | POA: Diagnosis not present

## 2014-06-07 DIAGNOSIS — M25561 Pain in right knee: Secondary | ICD-10-CM | POA: Diagnosis not present

## 2014-06-07 DIAGNOSIS — R269 Unspecified abnormalities of gait and mobility: Secondary | ICD-10-CM | POA: Diagnosis not present

## 2014-06-07 DIAGNOSIS — M6281 Muscle weakness (generalized): Secondary | ICD-10-CM | POA: Diagnosis not present

## 2014-06-12 DIAGNOSIS — R269 Unspecified abnormalities of gait and mobility: Secondary | ICD-10-CM | POA: Diagnosis not present

## 2014-06-12 DIAGNOSIS — M6281 Muscle weakness (generalized): Secondary | ICD-10-CM | POA: Diagnosis not present

## 2014-06-12 DIAGNOSIS — M25661 Stiffness of right knee, not elsewhere classified: Secondary | ICD-10-CM | POA: Diagnosis not present

## 2014-06-12 DIAGNOSIS — M25561 Pain in right knee: Secondary | ICD-10-CM | POA: Diagnosis not present

## 2014-06-14 DIAGNOSIS — R269 Unspecified abnormalities of gait and mobility: Secondary | ICD-10-CM | POA: Diagnosis not present

## 2014-06-14 DIAGNOSIS — M6281 Muscle weakness (generalized): Secondary | ICD-10-CM | POA: Diagnosis not present

## 2014-06-14 DIAGNOSIS — M25661 Stiffness of right knee, not elsewhere classified: Secondary | ICD-10-CM | POA: Diagnosis not present

## 2014-06-14 DIAGNOSIS — M25561 Pain in right knee: Secondary | ICD-10-CM | POA: Diagnosis not present

## 2014-06-19 DIAGNOSIS — M6281 Muscle weakness (generalized): Secondary | ICD-10-CM | POA: Diagnosis not present

## 2014-06-19 DIAGNOSIS — M25561 Pain in right knee: Secondary | ICD-10-CM | POA: Diagnosis not present

## 2014-06-19 DIAGNOSIS — M25661 Stiffness of right knee, not elsewhere classified: Secondary | ICD-10-CM | POA: Diagnosis not present

## 2014-06-19 DIAGNOSIS — R269 Unspecified abnormalities of gait and mobility: Secondary | ICD-10-CM | POA: Diagnosis not present

## 2014-06-21 DIAGNOSIS — M25561 Pain in right knee: Secondary | ICD-10-CM | POA: Diagnosis not present

## 2014-06-21 DIAGNOSIS — M6281 Muscle weakness (generalized): Secondary | ICD-10-CM | POA: Diagnosis not present

## 2014-06-21 DIAGNOSIS — R269 Unspecified abnormalities of gait and mobility: Secondary | ICD-10-CM | POA: Diagnosis not present

## 2014-06-21 DIAGNOSIS — M25661 Stiffness of right knee, not elsewhere classified: Secondary | ICD-10-CM | POA: Diagnosis not present

## 2014-06-25 DIAGNOSIS — Z96651 Presence of right artificial knee joint: Secondary | ICD-10-CM | POA: Diagnosis not present

## 2014-07-23 DIAGNOSIS — M25571 Pain in right ankle and joints of right foot: Secondary | ICD-10-CM | POA: Diagnosis not present

## 2014-07-23 DIAGNOSIS — Z96651 Presence of right artificial knee joint: Secondary | ICD-10-CM | POA: Diagnosis not present

## 2014-07-24 ENCOUNTER — Encounter (HOSPITAL_COMMUNITY): Payer: Commercial Managed Care - HMO

## 2014-07-24 ENCOUNTER — Other Ambulatory Visit (HOSPITAL_COMMUNITY): Payer: Self-pay | Admitting: Orthopedic Surgery

## 2014-07-24 DIAGNOSIS — M79604 Pain in right leg: Secondary | ICD-10-CM

## 2014-07-24 DIAGNOSIS — M7989 Other specified soft tissue disorders: Principal | ICD-10-CM

## 2014-07-25 ENCOUNTER — Ambulatory Visit (HOSPITAL_COMMUNITY): Payer: Commercial Managed Care - HMO | Attending: Orthopedic Surgery

## 2014-07-25 DIAGNOSIS — M79604 Pain in right leg: Secondary | ICD-10-CM | POA: Insufficient documentation

## 2014-07-25 DIAGNOSIS — M7989 Other specified soft tissue disorders: Secondary | ICD-10-CM | POA: Insufficient documentation

## 2014-07-31 ENCOUNTER — Encounter (HOSPITAL_COMMUNITY): Payer: Commercial Managed Care - HMO

## 2014-08-02 ENCOUNTER — Encounter (HOSPITAL_COMMUNITY): Payer: Commercial Managed Care - HMO

## 2014-08-07 DIAGNOSIS — Z1389 Encounter for screening for other disorder: Secondary | ICD-10-CM | POA: Diagnosis not present

## 2014-08-07 DIAGNOSIS — R590 Localized enlarged lymph nodes: Secondary | ICD-10-CM | POA: Diagnosis not present

## 2014-08-27 ENCOUNTER — Other Ambulatory Visit (HOSPITAL_COMMUNITY): Payer: Self-pay | Admitting: Family Medicine

## 2014-08-27 ENCOUNTER — Telehealth (HOSPITAL_COMMUNITY): Payer: Self-pay | Admitting: Family Medicine

## 2014-08-27 DIAGNOSIS — D47Z2 Castleman disease: Secondary | ICD-10-CM

## 2014-08-27 DIAGNOSIS — Z96651 Presence of right artificial knee joint: Secondary | ICD-10-CM | POA: Diagnosis not present

## 2014-08-28 ENCOUNTER — Encounter (HOSPITAL_COMMUNITY): Payer: Commercial Managed Care - HMO

## 2014-08-30 ENCOUNTER — Ambulatory Visit (HOSPITAL_COMMUNITY)
Admission: RE | Admit: 2014-08-30 | Discharge: 2014-08-30 | Disposition: A | Discharge status: Home / Self Care | Payer: Commercial Managed Care - HMO | Source: Ambulatory Visit | Attending: Family Medicine | Admitting: Family Medicine

## 2014-08-30 DIAGNOSIS — R599 Enlarged lymph nodes, unspecified: Secondary | ICD-10-CM | POA: Diagnosis not present

## 2014-08-30 DIAGNOSIS — D47Z2 Castleman disease: Secondary | ICD-10-CM

## 2014-08-30 DIAGNOSIS — R590 Localized enlarged lymph nodes: Secondary | ICD-10-CM | POA: Diagnosis not present

## 2014-09-03 ENCOUNTER — Other Ambulatory Visit: Payer: Self-pay | Admitting: Family Medicine

## 2014-09-03 DIAGNOSIS — R590 Localized enlarged lymph nodes: Secondary | ICD-10-CM

## 2014-09-05 ENCOUNTER — Ambulatory Visit
Admission: RE | Admit: 2014-09-05 | Discharge: 2014-09-05 | Disposition: A | Discharge status: Home / Self Care | Payer: Commercial Managed Care - HMO | Source: Ambulatory Visit | Attending: Family Medicine | Admitting: Family Medicine

## 2014-09-05 DIAGNOSIS — K449 Diaphragmatic hernia without obstruction or gangrene: Secondary | ICD-10-CM | POA: Diagnosis not present

## 2014-09-05 DIAGNOSIS — R590 Localized enlarged lymph nodes: Secondary | ICD-10-CM | POA: Diagnosis not present

## 2014-09-05 DIAGNOSIS — K769 Liver disease, unspecified: Secondary | ICD-10-CM | POA: Diagnosis not present

## 2014-09-05 MED ORDER — IOPAMIDOL (ISOVUE-300) INJECTION 61%
125.0000 mL | Freq: Once | INTRAVENOUS | Status: AC | PRN
  Administered 2014-09-05: 125 mL via INTRAVENOUS

## 2014-09-10 ENCOUNTER — Other Ambulatory Visit (HOSPITAL_COMMUNITY): Payer: Self-pay | Admitting: Family Medicine

## 2014-09-10 DIAGNOSIS — R599 Enlarged lymph nodes, unspecified: Secondary | ICD-10-CM

## 2014-09-10 DIAGNOSIS — K219 Gastro-esophageal reflux disease without esophagitis: Secondary | ICD-10-CM | POA: Diagnosis not present

## 2014-09-10 DIAGNOSIS — Z86010 Personal history of colonic polyps: Secondary | ICD-10-CM | POA: Diagnosis not present

## 2014-09-10 DIAGNOSIS — Z79899 Other long term (current) drug therapy: Secondary | ICD-10-CM | POA: Diagnosis not present

## 2014-09-12 ENCOUNTER — Ambulatory Visit (HOSPITAL_COMMUNITY)
Admission: RE | Admit: 2014-09-12 | Discharge: 2014-09-12 | Disposition: A | Discharge status: Home / Self Care | Payer: Commercial Managed Care - HMO | Source: Ambulatory Visit | Attending: Family Medicine | Admitting: Family Medicine

## 2014-09-12 DIAGNOSIS — R590 Localized enlarged lymph nodes: Secondary | ICD-10-CM | POA: Insufficient documentation

## 2014-09-12 DIAGNOSIS — R599 Enlarged lymph nodes, unspecified: Secondary | ICD-10-CM

## 2014-09-12 MED ORDER — LIDOCAINE HCL (PF) 1 % IJ SOLN
INTRAMUSCULAR | Status: AC
  Filled 2014-09-12: qty 10

## 2014-09-12 NOTE — Procedures (Signed)
Interventional Radiology Procedure Note  Procedure: US guided biopsy of right inguinal lymph node. .  Complications: None Recommendations:  - Ok to shower tomorrow - Do not submerge for 7 days - Routine care   Signed,  Dulcy Fanny. Earleen Newport, DO

## 2014-09-27 DIAGNOSIS — Z7952 Long term (current) use of systemic steroids: Secondary | ICD-10-CM | POA: Diagnosis not present

## 2014-10-29 DIAGNOSIS — Z96651 Presence of right artificial knee joint: Secondary | ICD-10-CM | POA: Diagnosis not present

## 2014-10-30 DIAGNOSIS — R7309 Other abnormal glucose: Secondary | ICD-10-CM | POA: Diagnosis not present

## 2014-10-30 DIAGNOSIS — I10 Essential (primary) hypertension: Secondary | ICD-10-CM | POA: Diagnosis not present

## 2014-10-30 DIAGNOSIS — I700 Atherosclerosis of aorta: Secondary | ICD-10-CM | POA: Diagnosis not present

## 2014-10-30 DIAGNOSIS — K219 Gastro-esophageal reflux disease without esophagitis: Secondary | ICD-10-CM | POA: Diagnosis not present

## 2014-10-30 DIAGNOSIS — Z23 Encounter for immunization: Secondary | ICD-10-CM | POA: Diagnosis not present

## 2014-10-31 DIAGNOSIS — I890 Lymphedema, not elsewhere classified: Secondary | ICD-10-CM | POA: Diagnosis not present

## 2014-11-14 DIAGNOSIS — I890 Lymphedema, not elsewhere classified: Secondary | ICD-10-CM | POA: Diagnosis not present

## 2014-11-22 DIAGNOSIS — I890 Lymphedema, not elsewhere classified: Secondary | ICD-10-CM | POA: Diagnosis not present

## 2014-11-28 DIAGNOSIS — I890 Lymphedema, not elsewhere classified: Secondary | ICD-10-CM | POA: Diagnosis not present

## 2014-12-10 DIAGNOSIS — I890 Lymphedema, not elsewhere classified: Secondary | ICD-10-CM | POA: Diagnosis not present

## 2014-12-10 DIAGNOSIS — Z96651 Presence of right artificial knee joint: Secondary | ICD-10-CM | POA: Diagnosis not present

## 2014-12-21 DIAGNOSIS — H35373 Puckering of macula, bilateral: Secondary | ICD-10-CM | POA: Diagnosis not present

## 2014-12-21 DIAGNOSIS — H2513 Age-related nuclear cataract, bilateral: Secondary | ICD-10-CM | POA: Diagnosis not present

## 2014-12-21 DIAGNOSIS — H40013 Open angle with borderline findings, low risk, bilateral: Secondary | ICD-10-CM | POA: Diagnosis not present

## 2014-12-21 DIAGNOSIS — E119 Type 2 diabetes mellitus without complications: Secondary | ICD-10-CM | POA: Diagnosis not present

## 2015-01-29 DIAGNOSIS — Z23 Encounter for immunization: Secondary | ICD-10-CM | POA: Diagnosis not present

## 2015-01-29 DIAGNOSIS — E119 Type 2 diabetes mellitus without complications: Secondary | ICD-10-CM | POA: Diagnosis not present

## 2015-02-13 DIAGNOSIS — J209 Acute bronchitis, unspecified: Secondary | ICD-10-CM | POA: Diagnosis not present

## 2015-02-21 DIAGNOSIS — R05 Cough: Secondary | ICD-10-CM | POA: Diagnosis not present

## 2015-02-25 DIAGNOSIS — Z96651 Presence of right artificial knee joint: Secondary | ICD-10-CM | POA: Diagnosis not present

## 2015-04-10 DIAGNOSIS — H40013 Open angle with borderline findings, low risk, bilateral: Secondary | ICD-10-CM | POA: Diagnosis not present

## 2015-04-29 DIAGNOSIS — K219 Gastro-esophageal reflux disease without esophagitis: Secondary | ICD-10-CM | POA: Diagnosis not present

## 2015-04-29 DIAGNOSIS — I10 Essential (primary) hypertension: Secondary | ICD-10-CM | POA: Diagnosis not present

## 2015-04-29 DIAGNOSIS — Z7984 Long term (current) use of oral hypoglycemic drugs: Secondary | ICD-10-CM | POA: Diagnosis not present

## 2015-04-29 DIAGNOSIS — E119 Type 2 diabetes mellitus without complications: Secondary | ICD-10-CM | POA: Diagnosis not present

## 2015-06-10 DIAGNOSIS — I10 Essential (primary) hypertension: Secondary | ICD-10-CM | POA: Diagnosis not present

## 2015-09-12 DIAGNOSIS — Z86010 Personal history of colonic polyps: Secondary | ICD-10-CM | POA: Diagnosis not present

## 2015-09-12 DIAGNOSIS — K219 Gastro-esophageal reflux disease without esophagitis: Secondary | ICD-10-CM | POA: Diagnosis not present

## 2015-11-12 DIAGNOSIS — Z0000 Encounter for general adult medical examination without abnormal findings: Secondary | ICD-10-CM | POA: Diagnosis not present

## 2015-11-12 DIAGNOSIS — Z1389 Encounter for screening for other disorder: Secondary | ICD-10-CM | POA: Diagnosis not present

## 2015-11-12 DIAGNOSIS — I700 Atherosclerosis of aorta: Secondary | ICD-10-CM | POA: Diagnosis not present

## 2015-11-12 DIAGNOSIS — M170 Bilateral primary osteoarthritis of knee: Secondary | ICD-10-CM | POA: Diagnosis not present

## 2015-11-12 DIAGNOSIS — K219 Gastro-esophageal reflux disease without esophagitis: Secondary | ICD-10-CM | POA: Diagnosis not present

## 2015-11-12 DIAGNOSIS — E119 Type 2 diabetes mellitus without complications: Secondary | ICD-10-CM | POA: Diagnosis not present

## 2015-11-12 DIAGNOSIS — Z125 Encounter for screening for malignant neoplasm of prostate: Secondary | ICD-10-CM | POA: Diagnosis not present

## 2015-11-12 DIAGNOSIS — I10 Essential (primary) hypertension: Secondary | ICD-10-CM | POA: Diagnosis not present

## 2015-11-12 DIAGNOSIS — E7800 Pure hypercholesterolemia, unspecified: Secondary | ICD-10-CM | POA: Diagnosis not present

## 2015-11-12 DIAGNOSIS — Z23 Encounter for immunization: Secondary | ICD-10-CM | POA: Diagnosis not present

## 2016-01-16 DIAGNOSIS — E7800 Pure hypercholesterolemia, unspecified: Secondary | ICD-10-CM | POA: Diagnosis not present

## 2016-01-29 DIAGNOSIS — H2513 Age-related nuclear cataract, bilateral: Secondary | ICD-10-CM | POA: Diagnosis not present

## 2016-01-29 DIAGNOSIS — H35373 Puckering of macula, bilateral: Secondary | ICD-10-CM | POA: Diagnosis not present

## 2016-01-29 DIAGNOSIS — H524 Presbyopia: Secondary | ICD-10-CM | POA: Diagnosis not present

## 2016-01-29 DIAGNOSIS — H40013 Open angle with borderline findings, low risk, bilateral: Secondary | ICD-10-CM | POA: Diagnosis not present

## 2016-03-09 DIAGNOSIS — M1611 Unilateral primary osteoarthritis, right hip: Secondary | ICD-10-CM | POA: Diagnosis not present

## 2016-03-09 DIAGNOSIS — Z96641 Presence of right artificial hip joint: Secondary | ICD-10-CM | POA: Diagnosis not present

## 2016-03-09 DIAGNOSIS — M1712 Unilateral primary osteoarthritis, left knee: Secondary | ICD-10-CM | POA: Diagnosis not present

## 2016-03-09 DIAGNOSIS — Z96651 Presence of right artificial knee joint: Secondary | ICD-10-CM | POA: Diagnosis not present

## 2016-03-17 DIAGNOSIS — M25551 Pain in right hip: Secondary | ICD-10-CM | POA: Diagnosis not present

## 2016-03-17 DIAGNOSIS — M1611 Unilateral primary osteoarthritis, right hip: Secondary | ICD-10-CM | POA: Diagnosis not present

## 2016-03-17 DIAGNOSIS — R531 Weakness: Secondary | ICD-10-CM | POA: Diagnosis not present

## 2016-03-17 DIAGNOSIS — M25562 Pain in left knee: Secondary | ICD-10-CM | POA: Diagnosis not present

## 2016-03-20 DIAGNOSIS — R531 Weakness: Secondary | ICD-10-CM | POA: Diagnosis not present

## 2016-03-20 DIAGNOSIS — M25551 Pain in right hip: Secondary | ICD-10-CM | POA: Diagnosis not present

## 2016-03-20 DIAGNOSIS — M25562 Pain in left knee: Secondary | ICD-10-CM | POA: Diagnosis not present

## 2016-03-20 DIAGNOSIS — M1611 Unilateral primary osteoarthritis, right hip: Secondary | ICD-10-CM | POA: Diagnosis not present

## 2016-03-25 DIAGNOSIS — M25551 Pain in right hip: Secondary | ICD-10-CM | POA: Diagnosis not present

## 2016-03-25 DIAGNOSIS — M25562 Pain in left knee: Secondary | ICD-10-CM | POA: Diagnosis not present

## 2016-03-25 DIAGNOSIS — R262 Difficulty in walking, not elsewhere classified: Secondary | ICD-10-CM | POA: Diagnosis not present

## 2016-03-25 DIAGNOSIS — R531 Weakness: Secondary | ICD-10-CM | POA: Diagnosis not present

## 2016-03-27 DIAGNOSIS — R531 Weakness: Secondary | ICD-10-CM | POA: Diagnosis not present

## 2016-03-27 DIAGNOSIS — M1611 Unilateral primary osteoarthritis, right hip: Secondary | ICD-10-CM | POA: Diagnosis not present

## 2016-03-27 DIAGNOSIS — M25562 Pain in left knee: Secondary | ICD-10-CM | POA: Diagnosis not present

## 2016-03-27 DIAGNOSIS — M25551 Pain in right hip: Secondary | ICD-10-CM | POA: Diagnosis not present

## 2016-03-31 DIAGNOSIS — M25551 Pain in right hip: Secondary | ICD-10-CM | POA: Diagnosis not present

## 2016-03-31 DIAGNOSIS — M25562 Pain in left knee: Secondary | ICD-10-CM | POA: Diagnosis not present

## 2016-03-31 DIAGNOSIS — R262 Difficulty in walking, not elsewhere classified: Secondary | ICD-10-CM | POA: Diagnosis not present

## 2016-03-31 DIAGNOSIS — R531 Weakness: Secondary | ICD-10-CM | POA: Diagnosis not present

## 2016-04-03 DIAGNOSIS — M25562 Pain in left knee: Secondary | ICD-10-CM | POA: Diagnosis not present

## 2016-04-03 DIAGNOSIS — R531 Weakness: Secondary | ICD-10-CM | POA: Diagnosis not present

## 2016-04-03 DIAGNOSIS — M1611 Unilateral primary osteoarthritis, right hip: Secondary | ICD-10-CM | POA: Diagnosis not present

## 2016-04-03 DIAGNOSIS — M25551 Pain in right hip: Secondary | ICD-10-CM | POA: Diagnosis not present

## 2016-04-07 DIAGNOSIS — N529 Male erectile dysfunction, unspecified: Secondary | ICD-10-CM | POA: Diagnosis not present

## 2016-04-08 DIAGNOSIS — M25562 Pain in left knee: Secondary | ICD-10-CM | POA: Diagnosis not present

## 2016-04-08 DIAGNOSIS — R531 Weakness: Secondary | ICD-10-CM | POA: Diagnosis not present

## 2016-04-08 DIAGNOSIS — M25551 Pain in right hip: Secondary | ICD-10-CM | POA: Diagnosis not present

## 2016-04-08 DIAGNOSIS — R262 Difficulty in walking, not elsewhere classified: Secondary | ICD-10-CM | POA: Diagnosis not present

## 2016-04-09 DIAGNOSIS — M25562 Pain in left knee: Secondary | ICD-10-CM | POA: Diagnosis not present

## 2016-04-09 DIAGNOSIS — R262 Difficulty in walking, not elsewhere classified: Secondary | ICD-10-CM | POA: Diagnosis not present

## 2016-04-09 DIAGNOSIS — R531 Weakness: Secondary | ICD-10-CM | POA: Diagnosis not present

## 2016-04-09 DIAGNOSIS — M25551 Pain in right hip: Secondary | ICD-10-CM | POA: Diagnosis not present

## 2016-04-15 DIAGNOSIS — M25562 Pain in left knee: Secondary | ICD-10-CM | POA: Diagnosis not present

## 2016-04-15 DIAGNOSIS — M25551 Pain in right hip: Secondary | ICD-10-CM | POA: Diagnosis not present

## 2016-04-15 DIAGNOSIS — R262 Difficulty in walking, not elsewhere classified: Secondary | ICD-10-CM | POA: Diagnosis not present

## 2016-04-15 DIAGNOSIS — R531 Weakness: Secondary | ICD-10-CM | POA: Diagnosis not present

## 2016-04-17 DIAGNOSIS — M1611 Unilateral primary osteoarthritis, right hip: Secondary | ICD-10-CM | POA: Diagnosis not present

## 2016-04-17 DIAGNOSIS — R531 Weakness: Secondary | ICD-10-CM | POA: Diagnosis not present

## 2016-04-17 DIAGNOSIS — M25562 Pain in left knee: Secondary | ICD-10-CM | POA: Diagnosis not present

## 2016-04-17 DIAGNOSIS — M25551 Pain in right hip: Secondary | ICD-10-CM | POA: Diagnosis not present

## 2016-04-24 DIAGNOSIS — R262 Difficulty in walking, not elsewhere classified: Secondary | ICD-10-CM | POA: Diagnosis not present

## 2016-04-24 DIAGNOSIS — M25562 Pain in left knee: Secondary | ICD-10-CM | POA: Diagnosis not present

## 2016-04-24 DIAGNOSIS — M25551 Pain in right hip: Secondary | ICD-10-CM | POA: Diagnosis not present

## 2016-04-24 DIAGNOSIS — R531 Weakness: Secondary | ICD-10-CM | POA: Diagnosis not present

## 2016-04-27 DIAGNOSIS — M1712 Unilateral primary osteoarthritis, left knee: Secondary | ICD-10-CM | POA: Diagnosis not present

## 2016-04-27 DIAGNOSIS — M2141 Flat foot [pes planus] (acquired), right foot: Secondary | ICD-10-CM | POA: Diagnosis not present

## 2016-04-27 DIAGNOSIS — Z96651 Presence of right artificial knee joint: Secondary | ICD-10-CM | POA: Diagnosis not present

## 2016-04-28 DIAGNOSIS — M1611 Unilateral primary osteoarthritis, right hip: Secondary | ICD-10-CM | POA: Diagnosis not present

## 2016-04-28 DIAGNOSIS — M25562 Pain in left knee: Secondary | ICD-10-CM | POA: Diagnosis not present

## 2016-04-28 DIAGNOSIS — R262 Difficulty in walking, not elsewhere classified: Secondary | ICD-10-CM | POA: Diagnosis not present

## 2016-04-28 DIAGNOSIS — M25551 Pain in right hip: Secondary | ICD-10-CM | POA: Diagnosis not present

## 2016-05-01 DIAGNOSIS — R531 Weakness: Secondary | ICD-10-CM | POA: Diagnosis not present

## 2016-05-01 DIAGNOSIS — M25562 Pain in left knee: Secondary | ICD-10-CM | POA: Diagnosis not present

## 2016-05-01 DIAGNOSIS — M25551 Pain in right hip: Secondary | ICD-10-CM | POA: Diagnosis not present

## 2016-05-01 DIAGNOSIS — R262 Difficulty in walking, not elsewhere classified: Secondary | ICD-10-CM | POA: Diagnosis not present

## 2016-05-05 DIAGNOSIS — M25562 Pain in left knee: Secondary | ICD-10-CM | POA: Diagnosis not present

## 2016-05-05 DIAGNOSIS — R262 Difficulty in walking, not elsewhere classified: Secondary | ICD-10-CM | POA: Diagnosis not present

## 2016-05-05 DIAGNOSIS — N529 Male erectile dysfunction, unspecified: Secondary | ICD-10-CM | POA: Diagnosis not present

## 2016-05-05 DIAGNOSIS — R531 Weakness: Secondary | ICD-10-CM | POA: Diagnosis not present

## 2016-05-05 DIAGNOSIS — M25551 Pain in right hip: Secondary | ICD-10-CM | POA: Diagnosis not present

## 2016-05-08 DIAGNOSIS — R262 Difficulty in walking, not elsewhere classified: Secondary | ICD-10-CM | POA: Diagnosis not present

## 2016-05-08 DIAGNOSIS — M25551 Pain in right hip: Secondary | ICD-10-CM | POA: Diagnosis not present

## 2016-05-08 DIAGNOSIS — T783XXA Angioneurotic edema, initial encounter: Secondary | ICD-10-CM | POA: Diagnosis not present

## 2016-05-08 DIAGNOSIS — M25562 Pain in left knee: Secondary | ICD-10-CM | POA: Diagnosis not present

## 2016-05-08 DIAGNOSIS — R531 Weakness: Secondary | ICD-10-CM | POA: Diagnosis not present

## 2016-05-12 DIAGNOSIS — I10 Essential (primary) hypertension: Secondary | ICD-10-CM | POA: Diagnosis not present

## 2016-05-12 DIAGNOSIS — Z7984 Long term (current) use of oral hypoglycemic drugs: Secondary | ICD-10-CM | POA: Diagnosis not present

## 2016-05-12 DIAGNOSIS — E291 Testicular hypofunction: Secondary | ICD-10-CM | POA: Diagnosis not present

## 2016-05-12 DIAGNOSIS — E7800 Pure hypercholesterolemia, unspecified: Secondary | ICD-10-CM | POA: Diagnosis not present

## 2016-05-12 DIAGNOSIS — E119 Type 2 diabetes mellitus without complications: Secondary | ICD-10-CM | POA: Diagnosis not present

## 2016-05-18 DIAGNOSIS — E291 Testicular hypofunction: Secondary | ICD-10-CM | POA: Diagnosis not present

## 2016-06-23 DIAGNOSIS — E291 Testicular hypofunction: Secondary | ICD-10-CM | POA: Diagnosis not present

## 2016-07-21 DIAGNOSIS — N5201 Erectile dysfunction due to arterial insufficiency: Secondary | ICD-10-CM | POA: Diagnosis not present

## 2016-07-21 DIAGNOSIS — R351 Nocturia: Secondary | ICD-10-CM | POA: Diagnosis not present

## 2016-07-21 DIAGNOSIS — N401 Enlarged prostate with lower urinary tract symptoms: Secondary | ICD-10-CM | POA: Diagnosis not present

## 2016-07-21 DIAGNOSIS — E291 Testicular hypofunction: Secondary | ICD-10-CM | POA: Diagnosis not present

## 2016-07-30 DIAGNOSIS — E291 Testicular hypofunction: Secondary | ICD-10-CM | POA: Diagnosis not present

## 2016-08-04 ENCOUNTER — Other Ambulatory Visit: Payer: Self-pay | Admitting: Family Medicine

## 2016-08-04 ENCOUNTER — Ambulatory Visit
Admission: RE | Admit: 2016-08-04 | Discharge: 2016-08-04 | Disposition: A | Discharge status: Home / Self Care | Payer: Medicare HMO | Source: Ambulatory Visit | Attending: Family Medicine | Admitting: Family Medicine

## 2016-08-04 DIAGNOSIS — E291 Testicular hypofunction: Secondary | ICD-10-CM | POA: Diagnosis not present

## 2016-08-04 DIAGNOSIS — J069 Acute upper respiratory infection, unspecified: Secondary | ICD-10-CM

## 2016-08-04 DIAGNOSIS — N5201 Erectile dysfunction due to arterial insufficiency: Secondary | ICD-10-CM | POA: Diagnosis not present

## 2016-08-04 DIAGNOSIS — R05 Cough: Secondary | ICD-10-CM | POA: Diagnosis not present

## 2016-08-11 DIAGNOSIS — R972 Elevated prostate specific antigen [PSA]: Secondary | ICD-10-CM | POA: Diagnosis not present

## 2016-08-11 DIAGNOSIS — E291 Testicular hypofunction: Secondary | ICD-10-CM | POA: Diagnosis not present

## 2016-08-13 DIAGNOSIS — E291 Testicular hypofunction: Secondary | ICD-10-CM | POA: Diagnosis not present

## 2016-08-13 DIAGNOSIS — J208 Acute bronchitis due to other specified organisms: Secondary | ICD-10-CM | POA: Diagnosis not present

## 2016-08-14 DIAGNOSIS — H40011 Open angle with borderline findings, low risk, right eye: Secondary | ICD-10-CM | POA: Diagnosis not present

## 2016-08-14 DIAGNOSIS — H401121 Primary open-angle glaucoma, left eye, mild stage: Secondary | ICD-10-CM | POA: Diagnosis not present

## 2016-09-03 DIAGNOSIS — E291 Testicular hypofunction: Secondary | ICD-10-CM | POA: Diagnosis not present

## 2016-09-03 DIAGNOSIS — N5201 Erectile dysfunction due to arterial insufficiency: Secondary | ICD-10-CM | POA: Diagnosis not present

## 2016-10-14 DIAGNOSIS — K219 Gastro-esophageal reflux disease without esophagitis: Secondary | ICD-10-CM | POA: Diagnosis not present

## 2016-10-14 DIAGNOSIS — Z86010 Personal history of colonic polyps: Secondary | ICD-10-CM | POA: Diagnosis not present

## 2016-11-17 DIAGNOSIS — K219 Gastro-esophageal reflux disease without esophagitis: Secondary | ICD-10-CM | POA: Diagnosis not present

## 2016-11-17 DIAGNOSIS — Z23 Encounter for immunization: Secondary | ICD-10-CM | POA: Diagnosis not present

## 2016-11-17 DIAGNOSIS — Z0000 Encounter for general adult medical examination without abnormal findings: Secondary | ICD-10-CM | POA: Diagnosis not present

## 2016-11-17 DIAGNOSIS — Z1389 Encounter for screening for other disorder: Secondary | ICD-10-CM | POA: Diagnosis not present

## 2016-11-17 DIAGNOSIS — E1165 Type 2 diabetes mellitus with hyperglycemia: Secondary | ICD-10-CM | POA: Diagnosis not present

## 2016-11-17 DIAGNOSIS — R05 Cough: Secondary | ICD-10-CM | POA: Diagnosis not present

## 2016-11-17 DIAGNOSIS — I10 Essential (primary) hypertension: Secondary | ICD-10-CM | POA: Diagnosis not present

## 2016-11-17 DIAGNOSIS — M170 Bilateral primary osteoarthritis of knee: Secondary | ICD-10-CM | POA: Diagnosis not present

## 2016-11-17 DIAGNOSIS — E7800 Pure hypercholesterolemia, unspecified: Secondary | ICD-10-CM | POA: Diagnosis not present

## 2016-11-26 DIAGNOSIS — E1165 Type 2 diabetes mellitus with hyperglycemia: Secondary | ICD-10-CM | POA: Diagnosis not present

## 2016-11-26 DIAGNOSIS — I10 Essential (primary) hypertension: Secondary | ICD-10-CM | POA: Diagnosis not present

## 2016-12-18 DIAGNOSIS — D649 Anemia, unspecified: Secondary | ICD-10-CM | POA: Diagnosis not present

## 2017-01-18 DIAGNOSIS — D649 Anemia, unspecified: Secondary | ICD-10-CM | POA: Diagnosis not present

## 2017-02-12 DIAGNOSIS — H401121 Primary open-angle glaucoma, left eye, mild stage: Secondary | ICD-10-CM | POA: Diagnosis not present

## 2017-02-12 DIAGNOSIS — H40052 Ocular hypertension, left eye: Secondary | ICD-10-CM | POA: Diagnosis not present

## 2017-02-12 DIAGNOSIS — H40011 Open angle with borderline findings, low risk, right eye: Secondary | ICD-10-CM | POA: Diagnosis not present

## 2017-05-18 DIAGNOSIS — K219 Gastro-esophageal reflux disease without esophagitis: Secondary | ICD-10-CM | POA: Diagnosis not present

## 2017-05-18 DIAGNOSIS — I10 Essential (primary) hypertension: Secondary | ICD-10-CM | POA: Diagnosis not present

## 2017-05-18 DIAGNOSIS — E1165 Type 2 diabetes mellitus with hyperglycemia: Secondary | ICD-10-CM | POA: Diagnosis not present

## 2017-05-18 DIAGNOSIS — Z7984 Long term (current) use of oral hypoglycemic drugs: Secondary | ICD-10-CM | POA: Diagnosis not present

## 2017-05-18 DIAGNOSIS — Z6829 Body mass index (BMI) 29.0-29.9, adult: Secondary | ICD-10-CM | POA: Diagnosis not present

## 2017-08-13 DIAGNOSIS — H40011 Open angle with borderline findings, low risk, right eye: Secondary | ICD-10-CM | POA: Diagnosis not present

## 2017-08-13 DIAGNOSIS — H401121 Primary open-angle glaucoma, left eye, mild stage: Secondary | ICD-10-CM | POA: Diagnosis not present

## 2017-08-17 DIAGNOSIS — Z7984 Long term (current) use of oral hypoglycemic drugs: Secondary | ICD-10-CM | POA: Diagnosis not present

## 2017-08-17 DIAGNOSIS — E119 Type 2 diabetes mellitus without complications: Secondary | ICD-10-CM | POA: Diagnosis not present

## 2017-09-06 DIAGNOSIS — R948 Abnormal results of function studies of other organs and systems: Secondary | ICD-10-CM | POA: Diagnosis not present

## 2017-09-06 DIAGNOSIS — E291 Testicular hypofunction: Secondary | ICD-10-CM | POA: Diagnosis not present

## 2017-09-14 DIAGNOSIS — E291 Testicular hypofunction: Secondary | ICD-10-CM | POA: Diagnosis not present

## 2017-09-14 DIAGNOSIS — Z125 Encounter for screening for malignant neoplasm of prostate: Secondary | ICD-10-CM | POA: Diagnosis not present

## 2017-09-14 DIAGNOSIS — N5201 Erectile dysfunction due to arterial insufficiency: Secondary | ICD-10-CM | POA: Diagnosis not present

## 2017-11-16 DIAGNOSIS — Z86010 Personal history of colonic polyps: Secondary | ICD-10-CM | POA: Diagnosis not present

## 2017-11-16 DIAGNOSIS — D120 Benign neoplasm of cecum: Secondary | ICD-10-CM | POA: Diagnosis not present

## 2017-11-16 DIAGNOSIS — Z8371 Family history of colonic polyps: Secondary | ICD-10-CM | POA: Diagnosis not present

## 2017-11-19 DIAGNOSIS — D120 Benign neoplasm of cecum: Secondary | ICD-10-CM | POA: Diagnosis not present

## 2017-12-02 DIAGNOSIS — E7800 Pure hypercholesterolemia, unspecified: Secondary | ICD-10-CM | POA: Diagnosis not present

## 2017-12-02 DIAGNOSIS — Z0000 Encounter for general adult medical examination without abnormal findings: Secondary | ICD-10-CM | POA: Diagnosis not present

## 2017-12-02 DIAGNOSIS — I10 Essential (primary) hypertension: Secondary | ICD-10-CM | POA: Diagnosis not present

## 2017-12-02 DIAGNOSIS — K219 Gastro-esophageal reflux disease without esophagitis: Secondary | ICD-10-CM | POA: Diagnosis not present

## 2017-12-02 DIAGNOSIS — Z23 Encounter for immunization: Secondary | ICD-10-CM | POA: Diagnosis not present

## 2017-12-02 DIAGNOSIS — E1169 Type 2 diabetes mellitus with other specified complication: Secondary | ICD-10-CM | POA: Diagnosis not present

## 2017-12-02 DIAGNOSIS — Z125 Encounter for screening for malignant neoplasm of prostate: Secondary | ICD-10-CM | POA: Diagnosis not present

## 2018-02-02 DIAGNOSIS — H401121 Primary open-angle glaucoma, left eye, mild stage: Secondary | ICD-10-CM | POA: Diagnosis not present

## 2018-02-02 DIAGNOSIS — H40011 Open angle with borderline findings, low risk, right eye: Secondary | ICD-10-CM | POA: Diagnosis not present

## 2018-02-02 DIAGNOSIS — E119 Type 2 diabetes mellitus without complications: Secondary | ICD-10-CM | POA: Diagnosis not present

## 2018-02-02 DIAGNOSIS — H2513 Age-related nuclear cataract, bilateral: Secondary | ICD-10-CM | POA: Diagnosis not present

## 2018-03-15 DIAGNOSIS — M1712 Unilateral primary osteoarthritis, left knee: Secondary | ICD-10-CM | POA: Diagnosis not present

## 2018-03-23 DIAGNOSIS — Z7984 Long term (current) use of oral hypoglycemic drugs: Secondary | ICD-10-CM | POA: Diagnosis not present

## 2018-03-23 DIAGNOSIS — I10 Essential (primary) hypertension: Secondary | ICD-10-CM | POA: Diagnosis not present

## 2018-03-23 DIAGNOSIS — E1169 Type 2 diabetes mellitus with other specified complication: Secondary | ICD-10-CM | POA: Diagnosis not present

## 2018-03-23 DIAGNOSIS — I700 Atherosclerosis of aorta: Secondary | ICD-10-CM | POA: Diagnosis not present

## 2018-03-23 DIAGNOSIS — M1712 Unilateral primary osteoarthritis, left knee: Secondary | ICD-10-CM | POA: Diagnosis not present

## 2018-03-23 DIAGNOSIS — Z01818 Encounter for other preprocedural examination: Secondary | ICD-10-CM | POA: Diagnosis not present

## 2018-03-30 ENCOUNTER — Encounter: Payer: Self-pay | Admitting: Physician Assistant

## 2018-03-30 DIAGNOSIS — M1712 Unilateral primary osteoarthritis, left knee: Secondary | ICD-10-CM

## 2018-03-30 DIAGNOSIS — E119 Type 2 diabetes mellitus without complications: Secondary | ICD-10-CM

## 2018-03-30 DIAGNOSIS — Z96651 Presence of right artificial knee joint: Secondary | ICD-10-CM

## 2018-03-30 HISTORY — DX: Presence of right artificial knee joint: Z96.651

## 2018-03-30 HISTORY — DX: Unilateral primary osteoarthritis, left knee: M17.12

## 2018-03-30 HISTORY — DX: Type 2 diabetes mellitus without complications: E11.9
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
## 2018-04-01 NOTE — Patient Instructions (Signed)
Ryan Bright  04/01/2018   Your procedure is scheduled on: 04-11-2018    Report to [facility] Main  Entrance     Report to [place] at 5:30AM    Call this number if you have problems the morning of surgery [phone]      Remember: Do not eat food or drink liquids :After Midnight. BRUSH YOUR TEETH MORNING OF SURGERY AND RINSE YOUR MOUTH OUT, NO CHEWING GUM CANDY OR MINTS.     Take these medicines the morning of surgery with A SIP OF WATER: amlodipine, protonix, rosuvastatin   DO NOT TAKE ANY DIABETIC MEDICATIONS DAY OF YOUR SURGERY YOU MUST CHECK YOUR BLOOD SUGAR THE MORNING OF SURGERY. REPORT TO YOUR NURSE ON ARRIVAL                                You may not have any metal on your body including hair pins and              piercings  Do not wear jewelry, make-up, lotions, powders or perfumes, deodorant                         Men may shave face and neck.   Do not bring valuables to the hospital. [place].  Contacts, dentures or bridgework may not be worn into surgery.  Leave suitcase in the car. After surgery it may be brought to your room.                Please read over the following fact sheets you were given: _____________________________________________________________________             [facility] - Preparing for Surgery Before surgery, you can play an important role.  Because skin is not sterile, your skin needs to be as free of germs as possible.  You can reduce the number of germs on your skin by washing with CHG (chlorahexidine gluconate) soap before surgery.  CHG is an antiseptic cleaner which kills germs and bonds with the skin to continue killing germs even after washing. Please DO NOT use if you have an allergy to CHG or antibacterial soaps.  If your skin becomes reddened/irritated stop using the CHG and inform your nurse when you arrive at Short Stay. Do not shave (including  legs and underarms) for at least 48 hours prior to the first CHG shower.  You may shave your face/neck. Please follow these instructions carefully:  1.  Shower with CHG Soap the night before surgery and the  morning of Surgery.  2.  If you choose to wash your hair, wash your hair first as usual with your  normal  shampoo.  3.  After you shampoo, rinse your hair and body thoroughly to remove the  shampoo.                           4.  Use CHG as you would any other liquid soap.  You can apply chg directly  to the skin and wash                       Gently with  a scrungie or clean washcloth.  5.  Apply the CHG Soap to your body ONLY FROM THE NECK DOWN.   Do not use on face/ open                           Wound or open sores. Avoid contact with eyes, ears mouth and genitals (private parts).                       Wash face,  Genitals (private parts) with your normal soap.             6.  Wash thoroughly, paying special attention to the area where your surgery  will be performed.  7.  Thoroughly rinse your body with warm water from the neck down.  8.  DO NOT shower/wash with your normal soap after using and rinsing off  the CHG Soap.                9.  Pat yourself dry with a clean towel.            10.  Wear clean pajamas.            11.  Place clean sheets on your bed the night of your first shower and do not  sleep with pets. Day of Surgery : Do not apply any lotions/deodorants the morning of surgery.  Please wear clean clothes to the hospital/surgery center.  FAILURE TO FOLLOW THESE INSTRUCTIONS MAY RESULT IN THE CANCELLATION OF YOUR SURGERY PATIENT SIGNATURE_________________________________  NURSE SIGNATURE__________________________________  ________________________________________________________________________   Ryan Bright  An incentive spirometer is a tool that can help keep your lungs clear and active. This tool measures how well you are filling your lungs with each breath.  Taking long deep breaths may help reverse or decrease the chance of developing breathing (pulmonary) problems (especially infection) following:  A long period of time when you are unable to move or be active. BEFORE THE PROCEDURE   If the spirometer includes an indicator to show your best effort, your nurse or respiratory therapist will set it to a desired goal.  If possible, sit up straight or lean slightly forward. Try not to slouch.  Hold the incentive spirometer in an upright position. INSTRUCTIONS FOR USE  1. Sit on the edge of your bed if possible, or sit up as far as you can in bed or on a chair. 2. Hold the incentive spirometer in an upright position. 3. Breathe out normally. 4. Place the mouthpiece in your mouth and seal your lips tightly around it. 5. Breathe in slowly and as deeply as possible, raising the piston or the ball toward the top of the column. 6. Hold your breath for 3-5 seconds or for as long as possible. Allow the piston or ball to fall to the bottom of the column. 7. Remove the mouthpiece from your mouth and breathe out normally. 8. Rest for a few seconds and repeat Steps 1 through 7 at least 10 times every 1-2 hours when you are awake. Take your time and take a few normal breaths between deep breaths. 9. The spirometer may include an indicator to show your best effort. Use the indicator as a goal to work toward during each repetition. 10. After each set of 10 deep breaths, practice coughing to be sure your lungs are clear. If you have an incision (the cut made at the time of surgery), support your  incision when coughing by placing a pillow or rolled up towels firmly against it. Once you are able to get out of bed, walk around indoors and cough well. You may stop using the incentive spirometer when instructed by your caregiver.  RISKS AND COMPLICATIONS  Take your time so you do not get dizzy or light-headed.  If you are in pain, you may need to take or ask for pain  medication before doing incentive spirometry. It is harder to take a deep breath if you are having pain. AFTER USE  Rest and breathe slowly and easily.  It can be helpful to keep track of a log of your progress. Your caregiver can provide you with a simple table to help with this. If you are using the spirometer at home, follow these instructions: [place] IF:   You are having difficultly using the spirometer.  You have trouble using the spirometer as often as instructed.  Your pain medication is not giving enough relief while using the spirometer.  You develop fever of 100.5 F (38.1 C) or higher. SEEK IMMEDIATE MEDICAL CARE IF:   You cough up bloody sputum that had not been present before.  You develop fever of 102 F (38.9 C) or greater.  You develop worsening pain at or near the incision site. MAKE SURE YOU:   Understand these instructions.  Will watch your condition.  Will get help right away if you are not doing well or get worse. Document Released: 06/15/2006 Document Revised: 04/27/2011 Document Reviewed: 08/16/2006 [facility] Patient Information 2014 [place], [place].   ________________________________________________________________________

## 2018-04-04 ENCOUNTER — Encounter (HOSPITAL_COMMUNITY): Payer: Self-pay

## 2018-04-04 ENCOUNTER — Other Ambulatory Visit: Payer: Self-pay

## 2018-04-04 ENCOUNTER — Encounter (HOSPITAL_COMMUNITY)
Admission: RE | Admit: 2018-04-04 | Discharge: 2018-04-04 | Disposition: A | Discharge status: Home / Self Care | Payer: Medicare HMO | Source: Ambulatory Visit | Attending: Orthopedic Surgery | Admitting: Orthopedic Surgery

## 2018-04-04 DIAGNOSIS — I10 Essential (primary) hypertension: Secondary | ICD-10-CM | POA: Insufficient documentation

## 2018-04-04 DIAGNOSIS — M1712 Unilateral primary osteoarthritis, left knee: Secondary | ICD-10-CM | POA: Insufficient documentation

## 2018-04-04 DIAGNOSIS — Z01818 Encounter for other preprocedural examination: Secondary | ICD-10-CM | POA: Insufficient documentation

## 2018-04-04 DIAGNOSIS — E119 Type 2 diabetes mellitus without complications: Secondary | ICD-10-CM | POA: Diagnosis not present

## 2018-04-04 HISTORY — DX: Pure hypercholesterolemia, unspecified: E78.00

## 2018-04-04 LAB — COMPREHENSIVE METABOLIC PANEL
ALT: 24 U/L (ref 0–44)
ANION GAP: 5 (ref 5–15)
AST: 20 U/L (ref 15–41)
Albumin: 4.3 g/dL (ref 3.5–5.0)
Alkaline Phosphatase: 103 U/L (ref 38–126)
BUN: 11 mg/dL (ref 6–20)
CO2: 29 mmol/L (ref 22–32)
Calcium: 9.3 mg/dL (ref 8.9–10.3)
Chloride: 105 mmol/L (ref 98–111)
Creatinine, Ser: 0.85 mg/dL (ref 0.61–1.24)
GFR calc Af Amer: >60 mL/min (ref >60)
GFR calc non Af Amer: >60 mL/min (ref >60)
Glucose, Bld: 104 mg/dL — ABNORMAL HIGH (ref 70–99)
POTASSIUM: 4.7 mmol/L (ref 3.5–5.1)
SODIUM: 139 mmol/L (ref 135–145)
Total Bilirubin: 1 mg/dL (ref 0.3–1.2)
Total Protein: 7.6 g/dL (ref 6.5–8.1)

## 2018-04-04 LAB — CBC WITH DIFFERENTIAL/PLATELET
Abs Immature Granulocytes: 0.01 10*3/uL (ref 0.00–0.07)
BASOS PCT: 1 %
Basophils Absolute: 0 10*3/uL (ref 0.0–0.1)
EOS ABS: 0.2 10*3/uL (ref 0.0–0.5)
Eosinophils Relative: 4 %
HCT: 44.4 % (ref 39.0–52.0)
Hemoglobin: 14 g/dL (ref 13.0–17.0)
Immature Granulocytes: 0 %
Lymphocytes Relative: 53 %
Lymphs Abs: 2.9 10*3/uL (ref 0.7–4.0)
MCH: 28.6 pg (ref 26.0–34.0)
MCHC: 31.5 g/dL (ref 30.0–36.0)
MCV: 90.6 fL (ref 80.0–100.0)
Monocytes Absolute: 0.5 10*3/uL (ref 0.1–1.0)
Monocytes Relative: 10 %
Neutro Abs: 1.7 10*3/uL (ref 1.7–7.7)
Neutrophils Relative %: 32 %
Platelets: 249 10*3/uL (ref 150–400)
RBC: 4.9 MIL/uL (ref 4.22–5.81)
RDW: 12.9 % (ref 11.5–15.5)
WBC: 5.4 10*3/uL (ref 4.0–10.5)
nRBC: 0 % (ref 0.0–0.2)

## 2018-04-04 LAB — ABO/RH: ABO/RH(D): AB POS

## 2018-04-04 LAB — SURGICAL PCR SCREEN
MRSA, PCR: NEGATIVE
Staphylococcus aureus: NEGATIVE

## 2018-04-04 LAB — PROTIME-INR
INR: 0.96
Prothrombin Time: 12.7 seconds (ref 11.4–15.2)

## 2018-04-04 LAB — APTT: APTT: 28 s (ref 24–36)

## 2018-04-04 LAB — GLUCOSE, CAPILLARY: Glucose-Capillary: 100 mg/dL — ABNORMAL HIGH (ref 70–99)

## 2018-04-04 NOTE — Progress Notes (Signed)
ekg 03-23-2018 on chart from [place] at [facility]

## 2018-04-05 LAB — HEMOGLOBIN A1C
Hgb A1c MFr Bld: 6.4 % — ABNORMAL HIGH (ref 4.8–5.6)
Mean Plasma Glucose: 137 mg/dL

## 2018-04-09 NOTE — Anesthesia Preprocedure Evaluation (Addendum)
Anesthesia Evaluation  Patient identified by MRN, date of birth, ID band Patient awake    Reviewed: Allergy & Precautions, NPO status , Patient's Chart, lab work & pertinent test results  History of Anesthesia Complications Negative for: history of anesthetic complications  Airway Mallampati: II  TM Distance: >3 FB Neck ROM: Full    Dental  (+) Dental Advisory Given, Partial Upper   Pulmonary neg pulmonary ROS,    Pulmonary exam normal breath sounds clear to auscultation       Cardiovascular hypertension, Pt. on medications Normal cardiovascular exam Rhythm:Regular Rate:Normal     Neuro/Psych negative neurological ROS     GI/Hepatic Neg liver ROS, hiatal hernia, GERD  Medicated and Controlled,  Endo/Other  diabetes, Type 2, Oral Hypoglycemic Agents  Renal/GU negative Renal ROS     Musculoskeletal  (+) Arthritis ,   Abdominal   Peds  Hematology negative hematology ROS (+)   Anesthesia Other Findings Day of surgery medications reviewed with the patient.  Reproductive/Obstetrics                            Anesthesia Physical Anesthesia Plan  ASA: II  Anesthesia Plan: Spinal   Post-op Pain Management:  Regional for Post-op pain   Induction:   PONV Risk Score and Plan: 1 and Treatment may vary due to age or medical condition, Ondansetron, Propofol infusion and Dexamethasone  Airway Management Planned: Natural Airway and Simple Face Mask  Additional Equipment:   Intra-op Plan:   Post-operative Plan:   Informed Consent: I have reviewed the patients History and Physical, chart, labs and discussed the procedure including the risks, benefits and alternatives for the proposed anesthesia with the patient or authorized representative who has indicated his/her understanding and acceptance.     Dental advisory given  Plan Discussed with: CRNA  Anesthesia Plan Comments:         Anesthesia Quick Evaluation

## 2018-04-10 MED ORDER — BUPIVACAINE LIPOSOME 1.3 % IJ SUSP
20.0000 mL | INTRAMUSCULAR | Status: DC
  Filled 2018-04-10: qty 20

## 2018-04-11 ENCOUNTER — Ambulatory Visit (HOSPITAL_COMMUNITY): Payer: Medicare HMO | Admitting: Physician Assistant

## 2018-04-11 ENCOUNTER — Ambulatory Visit (HOSPITAL_COMMUNITY)
Admission: RE | Admit: 2018-04-11 | Discharge: 2018-04-12 | Disposition: A | Discharge status: Home / Self Care | Payer: Medicare HMO | Attending: Orthopedic Surgery | Admitting: Orthopedic Surgery

## 2018-04-11 ENCOUNTER — Other Ambulatory Visit: Payer: Self-pay

## 2018-04-11 ENCOUNTER — Encounter (HOSPITAL_COMMUNITY)
Admission: RE | Disposition: A | Discharge status: Home / Self Care | Payer: Self-pay | Source: Home / Self Care | Attending: Orthopedic Surgery

## 2018-04-11 ENCOUNTER — Encounter (HOSPITAL_COMMUNITY): Payer: Self-pay | Admitting: *Deleted

## 2018-04-11 ENCOUNTER — Ambulatory Visit (HOSPITAL_COMMUNITY): Payer: Medicare HMO | Admitting: Anesthesiology

## 2018-04-11 DIAGNOSIS — Z96651 Presence of right artificial knee joint: Secondary | ICD-10-CM

## 2018-04-11 DIAGNOSIS — K219 Gastro-esophageal reflux disease without esophagitis: Secondary | ICD-10-CM | POA: Diagnosis not present

## 2018-04-11 DIAGNOSIS — E119 Type 2 diabetes mellitus without complications: Secondary | ICD-10-CM | POA: Diagnosis not present

## 2018-04-11 DIAGNOSIS — Z7984 Long term (current) use of oral hypoglycemic drugs: Secondary | ICD-10-CM | POA: Diagnosis not present

## 2018-04-11 DIAGNOSIS — Z7982 Long term (current) use of aspirin: Secondary | ICD-10-CM | POA: Insufficient documentation

## 2018-04-11 DIAGNOSIS — I10 Essential (primary) hypertension: Secondary | ICD-10-CM | POA: Diagnosis present

## 2018-04-11 DIAGNOSIS — Z6829 Body mass index (BMI) 29.0-29.9, adult: Secondary | ICD-10-CM | POA: Insufficient documentation

## 2018-04-11 DIAGNOSIS — E669 Obesity, unspecified: Secondary | ICD-10-CM | POA: Insufficient documentation

## 2018-04-11 DIAGNOSIS — Z79899 Other long term (current) drug therapy: Secondary | ICD-10-CM | POA: Diagnosis not present

## 2018-04-11 DIAGNOSIS — M1712 Unilateral primary osteoarthritis, left knee: Secondary | ICD-10-CM | POA: Diagnosis present

## 2018-04-11 DIAGNOSIS — G8918 Other acute postprocedural pain: Secondary | ICD-10-CM | POA: Diagnosis not present

## 2018-04-11 DIAGNOSIS — E7800 Pure hypercholesterolemia, unspecified: Secondary | ICD-10-CM | POA: Insufficient documentation

## 2018-04-11 HISTORY — PX: TOTAL KNEE ARTHROPLASTY: SHX125

## 2018-04-11 HISTORY — DX: Type 2 diabetes mellitus without complications: E11.9

## 2018-04-11 HISTORY — DX: Presence of right artificial knee joint: Z96.651

## 2018-04-11 HISTORY — DX: Unilateral primary osteoarthritis, left knee: M17.12

## 2018-04-11 LAB — GLUCOSE, CAPILLARY
Glucose-Capillary: 111 mg/dL — ABNORMAL HIGH (ref 70–99)
Glucose-Capillary: 133 mg/dL — ABNORMAL HIGH (ref 70–99)
Glucose-Capillary: 228 mg/dL — ABNORMAL HIGH (ref 70–99)
Glucose-Capillary: 242 mg/dL — ABNORMAL HIGH (ref 70–99)

## 2018-04-11 LAB — TYPE AND SCREEN
ABO/RH(D): AB POS
ANTIBODY SCREEN: NEGATIVE

## 2018-04-11 SURGERY — ARTHROPLASTY, KNEE, TOTAL
Anesthesia: Spinal | Site: Knee | Laterality: Left

## 2018-04-11 MED ORDER — ONDANSETRON HCL 4 MG/2ML IJ SOLN: 4.0000 mg | Freq: Four times a day (QID) | INTRAMUSCULAR | Status: DC | PRN

## 2018-04-11 MED ORDER — PHENOL 1.4 % MT LIQD: 1.0000 | OROMUCOSAL | Status: DC | PRN

## 2018-04-11 MED ORDER — EPHEDRINE 5 MG/ML INJ
INTRAVENOUS | Status: AC
  Filled 2018-04-11: qty 10

## 2018-04-11 MED ORDER — POTASSIUM CHLORIDE IN NACL 20-0.9 MEQ/L-% IV SOLN
INTRAVENOUS | Status: DC
  Administered 2018-04-11 (×2): via INTRAVENOUS
  Filled 2018-04-11 (×3): qty 1000

## 2018-04-11 MED ORDER — BUPIVACAINE-EPINEPHRINE (PF) 0.5% -1:200000 IJ SOLN
INTRAMUSCULAR | Status: AC
  Filled 2018-04-11: qty 60

## 2018-04-11 MED ORDER — FERROUS SULFATE 325 (65 FE) MG PO TABS
325.0000 mg | ORAL_TABLET | Freq: Every day | ORAL | Status: DC
  Administered 2018-04-12: 325 mg via ORAL
  Filled 2018-04-11: qty 1

## 2018-04-11 MED ORDER — BUPIVACAINE-EPINEPHRINE (PF) 0.5% -1:200000 IJ SOLN
INTRAMUSCULAR | Status: DC | PRN
  Administered 2018-04-11: 15 mL via PERINEURAL

## 2018-04-11 MED ORDER — ACETAMINOPHEN 10 MG/ML IV SOLN: 1000.0000 mg | Freq: Once | INTRAVENOUS | Status: DC | PRN

## 2018-04-11 MED ORDER — OXYCODONE HCL 5 MG/5ML PO SOLN: 5.0000 mg | Freq: Once | ORAL | Status: DC | PRN

## 2018-04-11 MED ORDER — DEXTROSE 5 % IV SOLN
3.0000 g | INTRAVENOUS | Status: AC
  Administered 2018-04-11: 3 g via INTRAVENOUS
  Filled 2018-04-11: qty 3

## 2018-04-11 MED ORDER — MIDAZOLAM HCL 5 MG/5ML IJ SOLN
INTRAMUSCULAR | Status: DC | PRN
  Administered 2018-04-11 (×2): 1 mg via INTRAVENOUS

## 2018-04-11 MED ORDER — HYDROMORPHONE HCL 1 MG/ML IJ SOLN
0.2500 mg | INTRAMUSCULAR | Status: DC | PRN
  Administered 2018-04-11: 0.5 mg via INTRAVENOUS
  Administered 2018-04-11: 0.25 mg via INTRAVENOUS
  Administered 2018-04-11 (×2): 0.5 mg via INTRAVENOUS
  Administered 2018-04-11: 0.25 mg via INTRAVENOUS

## 2018-04-11 MED ORDER — SODIUM CHLORIDE 0.9 % IR SOLN
Status: DC | PRN
  Administered 2018-04-11: 1000 mL

## 2018-04-11 MED ORDER — MIDAZOLAM HCL 2 MG/2ML IJ SOLN
INTRAMUSCULAR | Status: AC
  Filled 2018-04-11: qty 2

## 2018-04-11 MED ORDER — ASPIRIN EC 325 MG PO TBEC
325.0000 mg | DELAYED_RELEASE_TABLET | Freq: Every day | ORAL | Status: DC
  Administered 2018-04-12: 325 mg via ORAL
  Filled 2018-04-11: qty 1

## 2018-04-11 MED ORDER — PROPOFOL 10 MG/ML IV BOLUS
INTRAVENOUS | Status: DC | PRN
  Administered 2018-04-11: 180 mg via INTRAVENOUS
  Administered 2018-04-11: 40 mg via INTRAVENOUS

## 2018-04-11 MED ORDER — ROCURONIUM BROMIDE 100 MG/10ML IV SOLN
INTRAVENOUS | Status: DC | PRN
  Administered 2018-04-11: 60 mg via INTRAVENOUS

## 2018-04-11 MED ORDER — ROCURONIUM BROMIDE 100 MG/10ML IV SOLN
INTRAVENOUS | Status: AC
  Filled 2018-04-11: qty 1

## 2018-04-11 MED ORDER — DIPHENHYDRAMINE HCL 12.5 MG/5ML PO ELIX: 12.5000 mg | ORAL_SOLUTION | ORAL | Status: DC | PRN

## 2018-04-11 MED ORDER — CHLORHEXIDINE GLUCONATE 4 % EX LIQD: 60.0000 mL | Freq: Once | CUTANEOUS | Status: DC

## 2018-04-11 MED ORDER — DOCUSATE SODIUM 100 MG PO CAPS
100.0000 mg | ORAL_CAPSULE | Freq: Two times a day (BID) | ORAL | Status: DC
  Administered 2018-04-11 – 2018-04-12 (×2): 100 mg via ORAL
  Filled 2018-04-11 (×2): qty 1

## 2018-04-11 MED ORDER — ONDANSETRON HCL 4 MG/2ML IJ SOLN
INTRAMUSCULAR | Status: AC
  Filled 2018-04-11: qty 2

## 2018-04-11 MED ORDER — SODIUM CHLORIDE (PF) 0.9 % IJ SOLN
INTRAMUSCULAR | Status: AC
  Filled 2018-04-11: qty 50

## 2018-04-11 MED ORDER — DEXAMETHASONE SODIUM PHOSPHATE 10 MG/ML IJ SOLN
INTRAMUSCULAR | Status: AC
  Filled 2018-04-11: qty 1

## 2018-04-11 MED ORDER — ONDANSETRON HCL 4 MG/2ML IJ SOLN
INTRAMUSCULAR | Status: DC | PRN
  Administered 2018-04-11: 4 mg via INTRAVENOUS

## 2018-04-11 MED ORDER — MENTHOL 3 MG MT LOZG: 1.0000 | LOZENGE | OROMUCOSAL | Status: DC | PRN

## 2018-04-11 MED ORDER — ACETAMINOPHEN 500 MG PO TABS
1000.0000 mg | ORAL_TABLET | Freq: Four times a day (QID) | ORAL | Status: AC
  Administered 2018-04-11 – 2018-04-12 (×4): 1000 mg via ORAL
  Filled 2018-04-11 (×4): qty 2

## 2018-04-11 MED ORDER — HYDROMORPHONE HCL 1 MG/ML IJ SOLN
INTRAMUSCULAR | Status: AC
  Filled 2018-04-11: qty 2

## 2018-04-11 MED ORDER — POLYETHYLENE GLYCOL 3350 17 G PO PACK
17.0000 g | PACK | Freq: Two times a day (BID) | ORAL | Status: DC
  Administered 2018-04-11 – 2018-04-12 (×2): 17 g via ORAL
  Filled 2018-04-11 (×2): qty 1

## 2018-04-11 MED ORDER — INSULIN ASPART 100 UNIT/ML ~~LOC~~ SOLN
0.0000 [IU] | Freq: Every day | SUBCUTANEOUS | Status: DC
  Administered 2018-04-11: 2 [IU] via SUBCUTANEOUS

## 2018-04-11 MED ORDER — OXYCODONE HCL 5 MG PO TABS
5.0000 mg | ORAL_TABLET | ORAL | Status: DC | PRN
  Administered 2018-04-11 – 2018-04-12 (×3): 10 mg via ORAL
  Filled 2018-04-11 (×3): qty 2

## 2018-04-11 MED ORDER — ONDANSETRON HCL 4 MG PO TABS: 4.0000 mg | ORAL_TABLET | Freq: Four times a day (QID) | ORAL | Status: DC | PRN

## 2018-04-11 MED ORDER — GABAPENTIN 300 MG PO CAPS
300.0000 mg | ORAL_CAPSULE | Freq: Every day | ORAL | Status: DC
  Administered 2018-04-11: 300 mg via ORAL
  Filled 2018-04-11: qty 3

## 2018-04-11 MED ORDER — POVIDONE-IODINE 7.5 % EX SOLN: Freq: Once | CUTANEOUS | Status: DC

## 2018-04-11 MED ORDER — PANTOPRAZOLE SODIUM 40 MG PO TBEC
40.0000 mg | DELAYED_RELEASE_TABLET | Freq: Every day | ORAL | Status: DC
  Administered 2018-04-12: 40 mg via ORAL
  Filled 2018-04-11: qty 1

## 2018-04-11 MED ORDER — BUPIVACAINE LIPOSOME 1.3 % IJ SUSP
INTRAMUSCULAR | Status: DC | PRN
  Administered 2018-04-11: 20 mL

## 2018-04-11 MED ORDER — PROPOFOL 10 MG/ML IV BOLUS
INTRAVENOUS | Status: AC
  Filled 2018-04-11: qty 60

## 2018-04-11 MED ORDER — BUPIVACAINE-EPINEPHRINE 0.5% -1:200000 IJ SOLN
INTRAMUSCULAR | Status: DC | PRN
  Administered 2018-04-11: 15 mL

## 2018-04-11 MED ORDER — HYDROMORPHONE HCL 1 MG/ML IJ SOLN: 0.5000 mg | INTRAMUSCULAR | Status: DC | PRN

## 2018-04-11 MED ORDER — DEXAMETHASONE SODIUM PHOSPHATE 10 MG/ML IJ SOLN
10.0000 mg | Freq: Three times a day (TID) | INTRAMUSCULAR | Status: DC
  Administered 2018-04-11 – 2018-04-12 (×3): 10 mg via INTRAVENOUS
  Filled 2018-04-11 (×3): qty 1

## 2018-04-11 MED ORDER — FENTANYL CITRATE (PF) 100 MCG/2ML IJ SOLN
INTRAMUSCULAR | Status: DC | PRN
  Administered 2018-04-11 (×3): 50 ug via INTRAVENOUS
  Administered 2018-04-11 (×2): 25 ug via INTRAVENOUS

## 2018-04-11 MED ORDER — DEXAMETHASONE SODIUM PHOSPHATE 10 MG/ML IJ SOLN
INTRAMUSCULAR | Status: DC | PRN
  Administered 2018-04-11: 10 mg via INTRAVENOUS

## 2018-04-11 MED ORDER — TRANEXAMIC ACID-NACL 1000-0.7 MG/100ML-% IV SOLN
1000.0000 mg | INTRAVENOUS | Status: AC
  Administered 2018-04-11: 1000 mg via INTRAVENOUS
  Filled 2018-04-11: qty 100

## 2018-04-11 MED ORDER — LACTATED RINGERS IV SOLN
INTRAVENOUS | Status: DC
  Administered 2018-04-11 (×2): via INTRAVENOUS

## 2018-04-11 MED ORDER — ALUM & MAG HYDROXIDE-SIMETH 200-200-20 MG/5ML PO SUSP: 30.0000 mL | ORAL | Status: DC | PRN

## 2018-04-11 MED ORDER — LIDOCAINE 2% (20 MG/ML) 5 ML SYRINGE
INTRAMUSCULAR | Status: AC
  Filled 2018-04-11: qty 5

## 2018-04-11 MED ORDER — METOCLOPRAMIDE HCL 5 MG PO TABS: 5.0000 mg | ORAL_TABLET | Freq: Three times a day (TID) | ORAL | Status: DC | PRN

## 2018-04-11 MED ORDER — FENTANYL CITRATE (PF) 100 MCG/2ML IJ SOLN
INTRAMUSCULAR | Status: AC
  Filled 2018-04-11: qty 2

## 2018-04-11 MED ORDER — INSULIN ASPART 100 UNIT/ML ~~LOC~~ SOLN
0.0000 [IU] | Freq: Three times a day (TID) | SUBCUTANEOUS | Status: DC
  Administered 2018-04-11: 7 [IU] via SUBCUTANEOUS
  Administered 2018-04-12: 4 [IU] via SUBCUTANEOUS

## 2018-04-11 MED ORDER — OXYCODONE HCL 5 MG PO TABS: 5.0000 mg | ORAL_TABLET | Freq: Once | ORAL | Status: DC | PRN

## 2018-04-11 MED ORDER — ROSUVASTATIN CALCIUM 5 MG PO TABS: 5.0000 mg | ORAL_TABLET | Freq: Every day | ORAL | Status: DC

## 2018-04-11 MED ORDER — SODIUM CHLORIDE (PF) 0.9 % IJ SOLN
INTRAMUSCULAR | Status: DC | PRN
  Administered 2018-04-11: 50 mL

## 2018-04-11 MED ORDER — METOCLOPRAMIDE HCL 5 MG/ML IJ SOLN: 5.0000 mg | Freq: Three times a day (TID) | INTRAMUSCULAR | Status: DC | PRN

## 2018-04-11 MED ORDER — INSULIN GLARGINE 100 UNIT/ML ~~LOC~~ SOLN
20.0000 [IU] | Freq: Every day | SUBCUTANEOUS | Status: DC
  Administered 2018-04-11: 20 [IU] via SUBCUTANEOUS
  Filled 2018-04-11: qty 0.2

## 2018-04-11 MED ORDER — SUGAMMADEX SODIUM 500 MG/5ML IV SOLN
INTRAVENOUS | Status: AC
  Filled 2018-04-11: qty 5

## 2018-04-11 MED ORDER — STERILE WATER FOR IRRIGATION IR SOLN
Status: DC | PRN
  Administered 2018-04-11: 2000 mL

## 2018-04-11 MED ORDER — LIDOCAINE 2% (20 MG/ML) 5 ML SYRINGE
INTRAMUSCULAR | Status: DC | PRN
  Administered 2018-04-11: 100 mg via INTRAVENOUS

## 2018-04-11 MED ORDER — AMLODIPINE BESYLATE 10 MG PO TABS
10.0000 mg | ORAL_TABLET | Freq: Every day | ORAL | Status: DC
  Administered 2018-04-12: 10 mg via ORAL
  Filled 2018-04-11: qty 1

## 2018-04-11 MED ORDER — SUGAMMADEX SODIUM 200 MG/2ML IV SOLN
INTRAVENOUS | Status: DC | PRN
  Administered 2018-04-11: 200 mg via INTRAVENOUS

## 2018-04-11 MED ORDER — PROMETHAZINE HCL 25 MG/ML IJ SOLN: 6.2500 mg | INTRAMUSCULAR | Status: DC | PRN

## 2018-04-11 SURGICAL SUPPLY — 63 items
ATTUNE MED DOME PAT 41 KNEE (Knees) ×1 IMPLANT
ATTUNE PS FEM LT CEM SZ9 KNEE (Femur) ×1 IMPLANT
BAG ZIPLOCK 12X15 (MISCELLANEOUS) ×2 IMPLANT
BANDAGE ACE 6X5 VEL STRL LF (GAUZE/BANDAGES/DRESSINGS) ×1 IMPLANT
BASE TIBIAL ATTUNE KNEE SZ9 (Knees) IMPLANT
BENZOIN TINCTURE PRP APPL 2/3 (GAUZE/BANDAGES/DRESSINGS) ×1 IMPLANT
BLADE SAGITTAL 25.0X1.19X90 (BLADE) ×1 IMPLANT
BLADE SAW SGTL 13X75X1.27 (BLADE) ×1 IMPLANT
BNDG ELASTIC 6X10 VLCR STRL LF (GAUZE/BANDAGES/DRESSINGS) ×1 IMPLANT
BOWL SMART MIX CTS (DISPOSABLE) ×2 IMPLANT
CEMENT HV SMART SET (Cement) ×4 IMPLANT
COVER SURGICAL LIGHT HANDLE (MISCELLANEOUS) ×2 IMPLANT
CUFF TOURN SGL QUICK 34 (TOURNIQUET CUFF) ×1
CUFF TRNQT CYL 34X4.125X (TOURNIQUET CUFF) ×1 IMPLANT
DECANTER SPIKE VIAL GLASS SM (MISCELLANEOUS) ×1 IMPLANT
DRAPE ORTHO SPLIT 77X108 STRL (DRAPES) ×2
DRAPE SHEET LG 3/4 BI-LAMINATE (DRAPES) ×2 IMPLANT
DRAPE SURG ORHT 6 SPLT 77X108 (DRAPES) ×2 IMPLANT
DRAPE U-SHAPE 47X51 STRL (DRAPES) ×2 IMPLANT
DRESSING AQUACEL AG SP 3.5X10 (GAUZE/BANDAGES/DRESSINGS) IMPLANT
DRSG AQUACEL AG SP 3.5X10 (GAUZE/BANDAGES/DRESSINGS) ×2
DURAPREP 26ML APPLICATOR (WOUND CARE) ×6 IMPLANT
ELECT REM PT RETURN 15FT ADLT (MISCELLANEOUS) ×2 IMPLANT
EVACUATOR 1/8 PVC DRAIN (DRAIN) ×2 IMPLANT
GLOVE BIO SURGEON STRL SZ7 (GLOVE) ×5 IMPLANT
GLOVE BIOGEL PI IND STRL 7.0 (GLOVE) ×1 IMPLANT
GLOVE BIOGEL PI IND STRL 7.5 (GLOVE) ×1 IMPLANT
GLOVE BIOGEL PI IND STRL 8 (GLOVE) IMPLANT
GLOVE BIOGEL PI INDICATOR 7.0 (GLOVE) ×2
GLOVE BIOGEL PI INDICATOR 7.5 (GLOVE) ×3
GLOVE BIOGEL PI INDICATOR 8 (GLOVE) ×1
GLOVE ECLIPSE 7.5 STRL STRAW (GLOVE) ×2 IMPLANT
GLOVE ECLIPSE 8.0 STRL XLNG CF (GLOVE) ×1 IMPLANT
GLOVE SS BIOGEL STRL SZ 7.5 (GLOVE) ×2 IMPLANT
GLOVE SUPERSENSE BIOGEL SZ 7.5 (GLOVE) ×2
GOWN STRL REUS W/ TWL LRG LVL3 (GOWN DISPOSABLE) ×1 IMPLANT
GOWN STRL REUS W/ TWL XL LVL3 (GOWN DISPOSABLE) ×1 IMPLANT
GOWN STRL REUS W/TWL LRG LVL3 (GOWN DISPOSABLE) ×1
GOWN STRL REUS W/TWL XL LVL3 (GOWN DISPOSABLE) ×1
HANDPIECE INTERPULSE COAX TIP (DISPOSABLE) ×1
HOLDER FOLEY CATH W/STRAP (MISCELLANEOUS) ×1 IMPLANT
IMMOBILIZER KNEE 20 (SOFTGOODS) ×2
IMMOBILIZER KNEE 20 THIGH 36 (SOFTGOODS) IMPLANT
INSERT TIB ATTUNE RP SZ9X6 (Insert) ×1 IMPLANT
MANIFOLD NEPTUNE II (INSTRUMENTS) ×2 IMPLANT
NDL SAFETY ECLIPSE 18X1.5 (NEEDLE) IMPLANT
NEEDLE HYPO 18GX1.5 SHARP (NEEDLE) ×1
PACK TOTAL KNEE CUSTOM (KITS) ×2 IMPLANT
PIN STEINMAN FIXATION KNEE (PIN) ×1 IMPLANT
PIN THREADED HEADED SIGMA (PIN) ×1 IMPLANT
PROTECTOR NERVE ULNAR (MISCELLANEOUS) ×4 IMPLANT
SET HNDPC FAN SPRY TIP SCT (DISPOSABLE) ×1 IMPLANT
STRIP CLOSURE SKIN 1/2X4 (GAUZE/BANDAGES/DRESSINGS) ×2 IMPLANT
SUT VIC AB 0 CT1 27 (SUTURE) ×3
SUT VIC AB 0 CT1 27XBRD ANTBC (SUTURE) ×3 IMPLANT
SUT VIC AB 1 CT1 27 (SUTURE) ×3
SUT VIC AB 1 CT1 27XBRD ANTBC (SUTURE) ×3 IMPLANT
SUT VIC AB 2-0 CT1 27 (SUTURE) ×3
SUT VIC AB 2-0 CT1 TAPERPNT 27 (SUTURE) ×3 IMPLANT
SYR 3ML LL SCALE MARK (SYRINGE) IMPLANT
TIBIAL BASE ATTUNE KNEE SZ9 (Knees) ×2 IMPLANT
TIBIAL BASE ROT PLAT SZ 9 KNEE (Miscellaneous) ×1 IMPLANT
TRAY FOLEY MTR SLVR 16FR STAT (SET/KITS/TRAYS/PACK) ×2 IMPLANT

## 2018-04-11 NOTE — Anesthesia Procedure Notes (Signed)
Procedure Name: Intubation Date/Time: 04/11/2018 7:37 AM Performed by: [place] Nicholous Girgenti C, CRNA Pre-anesthesia Checklist: Patient identified, Emergency Drugs available, Suction available and Patient being monitored Patient Re-evaluated:Patient Re-evaluated prior to induction Oxygen Delivery Method: Circle system utilized Preoxygenation: Pre-oxygenation with 100% oxygen Induction Type: IV induction Ventilation: Mask ventilation without difficulty Laryngoscope Size: Glidescope and 4 Grade View: Grade I Tube type: Oral Tube size: 7.5 mm Number of attempts: 1 Airway Equipment and Method: Rigid stylet Placement Confirmation: ETT inserted through vocal cords under direct vision,  positive ETCO2 and breath sounds checked- equal and bilateral Secured at: 24 cm Tube secured with: Tape Dental Injury: Teeth and Oropharynx as per pre-operative assessment  Difficulty Due To: Difficulty was unanticipated Comments: DL x 1 by SA- Epiglottis only seen.  Unable to pass boogie; pt mask ventilated easily, Glidescope intubation performed easily.  7.5 ETT passed with direct visualization.  VSS throughout

## 2018-04-11 NOTE — Interval H&P Note (Signed)
History and Physical Interval Note:  04/11/2018 6:42 AM  Ryan Bright  has presented today for surgery, with the diagnosis of OA LEFT KNEE  The various methods of treatment have been discussed with the patient and family. After consideration of risks, benefits and other options for treatment, the patient has consented to  Procedure(s): TOTAL KNEE ARTHROPLASTY (Left) as a surgical intervention .  The patient's history has been reviewed, patient examined, no change in status, stable for surgery.  I have reviewed the patient's chart and labs.  Questions were answered to the patient's satisfaction.     Lorn Junes

## 2018-04-11 NOTE — Op Note (Signed)
MRN:     [id number] DOB/AGE:    Nov 11, 1960 / 58 y.o.       OPERATIVE REPORT   DATE OF PROCEDURE:  04/11/2018      PREOPERATIVE DIAGNOSIS:   Primary Localized Osteoarthritis left Knee       Estimated body mass index is 28.5 kg/m as calculated from the following:   Height as of this encounter: 6\' 7"  (2.007 m).   Weight as of this encounter: 114.8 kg.                                                       POSTOPERATIVE DIAGNOSIS:   Same                                                                 PROCEDURE:  Procedure(s): TOTAL KNEE ARTHROPLASTY Using Depuy Attune RP implants #9 Femur, #9Tibia, 66mm  RP bearing, 41 Patella    SURGEON: Toneshia Coello A. Noemi Chapel, MD   ASSISTANT: Matthew Saras, PA-C, present and scrubbed throughout the case, critical for retraction, instrumentation, and closure.  ANESTHESIA: Spinal with Adductor Nerve Block  TOURNIQUET TIME: 65 minutes   COMPLICATIONS:  None       SPECIMENS: None   INDICATIONS FOR PROCEDURE: The patient has djd of the knee with varus deformities, XR shows bone on bone arthritis. Patient has failed all conservative measures including anti-inflammatory medicines, narcotics, attempts at exercise and weight loss, cortisone injections and viscosupplementation.  Risks and benefits of surgery have been discussed, questions answered.    DESCRIPTION OF PROCEDURE: The patient identified by armband, received right adductor canal block and IV antibiotics, in the holding area at [facility]. Patient taken to the operating room, appropriate anesthetic monitors were attached. Spinal anesthesia induced with the patient in supine position, Foley catheter was inserted. Tourniquet applied high to the operative thigh. Lateral post and foot positioner applied to the table, the lower extremity was then prepped and draped in usual sterile fashion from the ankle to the tourniquet. Time-out procedure was performed. The limb was wrapped with an Esmarch bandage  and the tourniquet inflated to 365 mmHg.   We began the operation by making a 6cm anterior midline incision. Small bleeders in the skin and the subcutaneous tissue identified and cauterized. Transverse retinaculum was incised and reflected medially and a medial parapatellar arthrotomy was accomplished. the patella was everted and theprepatellar fat pad resected. The superficial medial collateral ligament was then elevated from anterior to posterior along the proximal flare of the tibia and anterior half of the menisci resected. The knee was hyperflexed exposing bone on bone arthritis. Peripheral and notch osteophytes as well as the cruciate ligaments were then resected. We continued to work our way around posteriorly along the proximal tibia, and externally rotated the tibia subluxing it out from underneath the femur. A McHale retractor was placed through the notch and a lateral Hohmann retractor placed, and an external tibial guide was placed.  The tibial cutting guide was pinned into place allowing resection of 6 mm of bone medially and about 4 mm of bone laterally because of her  valgus deformity.   Satisfied with the tibial resection, we then entered the distal femur 2 mm anterior to the PCL origin with the intramedullary guide rod and applied the distal femoral cutting guide set at 26mm, with 5 degrees of valgus. This was pinned along the epicondylar axis. At this point, the distal femoral cut was accomplished without difficulty. We then sized for a 9 femoral component and pinned the guide in 3 degrees of external rotation.The chamfer cutting guide was pinned into place. The anterior, posterior, and chamfer cuts were accomplished without difficulty followed by the  RP box cutting guide and the box cut. We also removed posterior osteophytes from the posterior femoral condyles. At this time, the knee was brought into full extension. We checked our extension and flexion gaps and found them symmetric at  6.  The patella thickness measured at 59m m. We set the cutting guide at 15 and removed the posterior patella sized for 41 button and drilled the lollipop. The knee was then once again hyperflexed exposing the proximal tibia. We sized for a # 9 tibial base plate, applied the smokestack and the conical reamer followed by the the Delta fin keel punch. We then hammered into place the  RP trial femoral component, inserted a trial bearing, trial patellar button, and took the knee through range of motion from 0-130 degrees. No thumb pressure was required for patellar tracking.   At this point, all trial components were removed, a double batch of DePuy HV cement  was mixed and applied to all bony metallic mating surfaces. In order, we hammered into place the tibial tray and removed excess cement, the femoral component and removed excess cement, a 6 mm  RP bearing was inserted, and the knee brought to full extension with compression. The patellar button was clamped into place, and excess cement removed. While the cement cured the wound was irrigated out with normal saline solution pulse lavage, and exparel was injected throughout the knee. Ligament stability and patellar tracking were checked and found to be excellent..   The parapatellar arthrotomy was closed with  #1 Vicryl suture. The subcutaneous tissue with 0 and 2-0 undyed Vicryl suture, and 4-0 Monocryl.. A dressing of Aquaseal, 4 x 4, dressing sponges, Webril, and Ace wrap applied. Needle and sponge count were correct times 2.The patient awakened, extubated, and taken to recovery room without difficulty. Vascular status was normal, pulses 2+ and symmetric.    Lorn Junes 05/10/2017, 8:56 AM

## 2018-04-11 NOTE — Anesthesia Postprocedure Evaluation (Signed)
Anesthesia Post Note  Patient: Ryan Bright  Procedure(s) Performed: TOTAL KNEE ARTHROPLASTY (Left Knee)     Patient location during evaluation: PACU Anesthesia Type: General Level of consciousness: awake and alert Pain management: pain level controlled Vital Signs Assessment: post-procedure vital signs reviewed and stable Respiratory status: spontaneous breathing, nonlabored ventilation and respiratory function stable Cardiovascular status: blood pressure returned to baseline and stable Postop Assessment: no apparent nausea or vomiting Anesthetic complications: no    Last Vitals:  Vitals:   04/11/18 1045 04/11/18 1100  BP: (!) 144/90 135/88  Pulse: 73 69  Resp: 16 16  Temp:    SpO2: 100% 99%    Last Pain:  Vitals:   04/11/18 1100  TempSrc:   PainSc: [place]

## 2018-04-11 NOTE — Transfer of Care (Signed)
Immediate Anesthesia Transfer of Care Note  Patient: Ryan Bright  Procedure(s) Performed: TOTAL KNEE ARTHROPLASTY (Left Knee)  Patient Location: PACU  Anesthesia Type:General and GA combined with regional for post-op pain  Level of Consciousness: awake, alert  and oriented  Airway & Oxygen Therapy: Patient Spontanous Breathing and Patient connected to face mask oxygen  Post-op Assessment: Report given to RN and Post -op Vital signs reviewed and stable  Post vital signs: Reviewed and stable  Last Vitals:  Vitals Value Taken Time  BP    Temp    Pulse    Resp    SpO2      Last Pain:  Vitals:   04/11/18 0556  TempSrc:   PainSc: 0-No pain      Patients Stated Pain Goal: 3 (72/09/47 0962)  Complications: No apparent anesthesia complications

## 2018-04-11 NOTE — Evaluation (Signed)
Physical Therapy Evaluation Patient Details Name: Ryan Bright MRN: [id number] DOB: 01-21-1961 Today's Date: 04/11/2018   History of Present Illness  s/p L TKA. PMHx: R TKA & revision  Clinical Impression  Pt is s/p TKA resulting in the deficits listed below (see PT Problem List).  Pt should continue to progress well; he is anxious to go home on Tuesday, has 3 steps with bil rails. Continue PT in acute setting  Pt will benefit from skilled PT to increase their independence and safety with mobility to allow discharge to the venue listed below.      Follow Up Recommendations Follow surgeon's recommendation for DC plan and follow-up therapies    Equipment Recommendations  None recommended by PT    Recommendations for Other Services       Precautions / Restrictions Precautions Precautions: Fall;Knee Precaution Comments: did not use KI today, IND SLRs;  Required Braces or Orthoses: Knee Immobilizer - Left Knee Immobilizer - Left: Discontinue once straight leg raise with < 10 degree lag Restrictions Weight Bearing Restrictions: No Other Position/Activity Restrictions: WBAT      Mobility  Bed Mobility Overal bed mobility: Needs Assistance Bed Mobility: Supine to Sit     Supine to sit: Min guard     General bed mobility comments: for safety  Transfers Overall transfer level: Needs assistance Equipment used: Rolling walker (2 wheeled) Transfers: Sit to/from Stand Sit to Stand: From elevated surface;Min assist         General transfer comment: assist to rise and steady, cues for hand placement and LLE position   Ambulation/Gait Ambulation/Gait assistance: Min assist Gait Distance (Feet): 60 Feet Assistive device: Rolling walker (2 wheeled) Gait Pattern/deviations: Step-to pattern;Decreased weight shift to left     General Gait Details: cues for sequence, step length, RW position  Stairs            Wheelchair Mobility    Modified Rankin (Stroke  Patients Only)       Balance                                             Pertinent Vitals/Pain Pain Assessment: 0-10 Pain Score: 4  Pain Location: L knee Pain Descriptors / Indicators: Sore Pain Intervention(s): Limited activity within patient's tolerance;Monitored during session;Premedicated before session;Repositioned;Ice applied    Home Living Family/patient expects to be discharged to:: Private residence Living Arrangements: Spouse/significant other Available Help at Discharge: Family;Available 24 hours/day Type of Home: House Home Access: Stairs to enter Entrance Stairs-Rails: Right;Left;Can reach both Entrance Stairs-Number of Steps: 3 Home Layout: One level Home Equipment: Bedside commode;Grab bars - tub/shower;Adaptive equipment;Crutches;Hand held shower head      Prior Function Level of Independence: Independent               Hand Dominance        Extremity/Trunk Assessment   Upper Extremity Assessment Upper Extremity Assessment: Overall WFL for tasks assessed    Lower Extremity Assessment Lower Extremity Assessment: LLE deficits/detail LLE Deficits / Details: ankle WFL; knee extension and hip flexion 2+/5       Communication   Communication: No difficulties  Cognition Arousal/Alertness: Awake/alert Behavior During Therapy: WFL for tasks assessed/performed Overall Cognitive Status: Within Functional Limits for tasks assessed  General Comments      Exercises Total Joint Exercises Ankle Circles/Pumps: AROM;Both;10 reps Quad Sets: AROM;5 reps;Both Goniometric ROM: grossly 10* to 65* AAROM L knee flexion   Assessment/Plan    PT Assessment Patient needs continued PT services  PT Problem List Decreased strength;Decreased activity tolerance;Decreased mobility;Decreased knowledge of use of DME;Decreased knowledge of precautions;Pain;Decreased range of motion       PT  Treatment Interventions Stair training;Gait training;DME instruction;Patient/family education;Therapeutic activities;Functional mobility training;Therapeutic exercise    PT Goals (Current goals can be found in the Care Plan section)  Acute Rehab PT Goals Patient Stated Goal: go home tomorrow PT Goal Formulation: With patient Time For Goal Achievement: 04/18/18 Potential to Achieve Goals: Good    Frequency 7X/week   Barriers to discharge        Co-evaluation               AM-PAC PT "6 Clicks" Mobility  Outcome Measure Help needed turning from your back to your side while in a flat bed without using bedrails?: A Little Help needed moving from lying on your back to sitting on the side of a flat bed without using bedrails?: A Little Help needed moving to and from a bed to a chair (including a wheelchair)?: A Little Help needed standing up from a chair using your arms (e.g., wheelchair or bedside chair)?: A Little Help needed to walk in hospital room?: A Little Help needed climbing 3-5 steps with a railing? : A Lot 6 Click Score: 17    End of Session Equipment Utilized During Treatment: Gait belt Activity Tolerance: Patient tolerated treatment well Patient left: in chair;with family/visitor present;with chair alarm set;Other (comment);with call bell/phone within reach(bone foam) Nurse Communication: Mobility status PT Visit Diagnosis: Difficulty in walking, not elsewhere classified (R26.2)    Time: 9758-8325 PT Time Calculation (min) (ACUTE ONLY): 22 min   Charges:   PT Evaluation $PT Eval Low Complexity: 1 Low          Kenyon Ana, PT  Pager: [phone] Acute Rehab Dept [facility]): [phone]   04/11/2018   [facility] 04/11/2018, 4:55 PM

## 2018-04-11 NOTE — Anesthesia Procedure Notes (Signed)
Anesthesia Regional Block: Adductor canal block   Pre-Anesthetic Checklist: ,, timeout performed, Correct Patient, Correct Site, Correct Laterality, Correct Procedure, Correct Position, site marked, Risks and benefits discussed, pre-op evaluation,  At surgeon's request and post-op pain management  Laterality: Left  Prep: Maximum Sterile Barrier Precautions used, chloraprep       Needles:  Injection technique: Single-shot  Needle Type: Echogenic Stimulator Needle     Needle Length: 9cm  Needle Gauge: 22     Additional Needles:   Procedures:,,,, ultrasound used (permanent image in chart),,,,  Narrative:  Start time: 04/11/2018 7:00 AM End time: 04/11/2018 7:03 AM Injection made incrementally with aspirations every 5 mL.  Performed by: Personally  Anesthesiologist: Brennan Bailey, MD  Additional Notes: Risks, benefits, and alternative discussed. Patient gave consent for procedure. Patient prepped and draped in sterile fashion. Sedation administered, patient remains easily responsive to voice. Relevant anatomy identified with ultrasound guidance. Local anesthetic given in 5cc increments with no signs or symptoms of intravascular injection. No pain or paraesthesias with injection. Patient monitored throughout procedure with signs of LAST or immediate complications. Tolerated well. Ultrasound image placed in chart.  Tawny Asal, MD

## 2018-04-12 ENCOUNTER — Encounter (HOSPITAL_COMMUNITY): Payer: Self-pay | Admitting: Orthopedic Surgery

## 2018-04-12 DIAGNOSIS — Z7982 Long term (current) use of aspirin: Secondary | ICD-10-CM | POA: Diagnosis not present

## 2018-04-12 DIAGNOSIS — M1712 Unilateral primary osteoarthritis, left knee: Secondary | ICD-10-CM | POA: Diagnosis not present

## 2018-04-12 DIAGNOSIS — Z96652 Presence of left artificial knee joint: Secondary | ICD-10-CM | POA: Diagnosis not present

## 2018-04-12 DIAGNOSIS — E7800 Pure hypercholesterolemia, unspecified: Secondary | ICD-10-CM | POA: Diagnosis not present

## 2018-04-12 DIAGNOSIS — I10 Essential (primary) hypertension: Secondary | ICD-10-CM | POA: Diagnosis not present

## 2018-04-12 DIAGNOSIS — K219 Gastro-esophageal reflux disease without esophagitis: Secondary | ICD-10-CM | POA: Diagnosis not present

## 2018-04-12 DIAGNOSIS — Z96651 Presence of right artificial knee joint: Secondary | ICD-10-CM | POA: Diagnosis not present

## 2018-04-12 DIAGNOSIS — E119 Type 2 diabetes mellitus without complications: Secondary | ICD-10-CM | POA: Diagnosis not present

## 2018-04-12 DIAGNOSIS — Z79899 Other long term (current) drug therapy: Secondary | ICD-10-CM | POA: Diagnosis not present

## 2018-04-12 DIAGNOSIS — Z7984 Long term (current) use of oral hypoglycemic drugs: Secondary | ICD-10-CM | POA: Diagnosis not present

## 2018-04-12 LAB — CBC
HCT: 33.8 % — ABNORMAL LOW (ref 39.0–52.0)
Hemoglobin: 10.5 g/dL — ABNORMAL LOW (ref 13.0–17.0)
MCH: 28.3 pg (ref 26.0–34.0)
MCHC: 31.1 g/dL (ref 30.0–36.0)
MCV: 91.1 fL (ref 80.0–100.0)
PLATELETS: 212 10*3/uL (ref 150–400)
RBC: 3.71 MIL/uL — ABNORMAL LOW (ref 4.22–5.81)
RDW: 12.7 % (ref 11.5–15.5)
WBC: 13.2 10*3/uL — ABNORMAL HIGH (ref 4.0–10.5)
nRBC: 0 % (ref 0.0–0.2)

## 2018-04-12 LAB — BASIC METABOLIC PANEL
Anion gap: 5 (ref 5–15)
BUN: 16 mg/dL (ref 6–20)
CO2: 25 mmol/L (ref 22–32)
Calcium: 8.5 mg/dL — ABNORMAL LOW (ref 8.9–10.3)
Chloride: 107 mmol/L (ref 98–111)
Creatinine, Ser: 0.95 mg/dL (ref 0.61–1.24)
GFR calc Af Amer: >60 mL/min (ref >60)
Glucose, Bld: 200 mg/dL — ABNORMAL HIGH (ref 70–99)
Potassium: 4.2 mmol/L (ref 3.5–5.1)
Sodium: 137 mmol/L (ref 135–145)

## 2018-04-12 LAB — GLUCOSE, CAPILLARY: Glucose-Capillary: 168 mg/dL — ABNORMAL HIGH (ref 70–99)

## 2018-04-12 MED ORDER — POLYETHYLENE GLYCOL 3350 17 G PO PACK: 17.0000 g | PACK | Freq: Two times a day (BID) | ORAL | 0 refills | Status: AC

## 2018-04-12 MED ORDER — ASPIRIN 325 MG PO TBEC: 325.0000 mg | DELAYED_RELEASE_TABLET | Freq: Every day | ORAL | 0 refills | Status: AC

## 2018-04-12 MED ORDER — OXYCODONE HCL 5 MG PO TABS: 5.0000 mg | ORAL_TABLET | ORAL | 0 refills | Status: AC | PRN

## 2018-04-12 MED ORDER — GABAPENTIN 300 MG PO CAPS: 300.0000 mg | ORAL_CAPSULE | Freq: Every day | ORAL | 0 refills | Status: AC

## 2018-04-12 NOTE — Care Management Note (Signed)
Case Management Note  Patient Details  Name: Ryan Bright MRN: [id number] Date of Birth: 1960-05-18  Subjective/Objective:                  Discharge planning  Action/Plan: hhc=oopt dme - has needed equipment Expected Discharge Date:  04/12/18               Expected Discharge Plan:  Home/Self Care  In-House Referral:     Discharge planning Services  CM Consult  Post Acute Care Choice:    Choice offered to:     DME Arranged:    DME Agency:     HH Arranged:    HH Agency:     Status of Service:     If discussed at [employer], dates discussed:    Additional Comments:  Leeroy Cha, RN 04/12/2018, 10:31 AM
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
## 2018-04-12 NOTE — Progress Notes (Signed)
Physical Therapy Treatment Patient Details Name: Ryan Bright MRN: [id number] DOB: 1960/07/27 Today's Date: 04/12/2018    History of Present Illness s/p L TKA. PMHx: R TKA & revision    PT Comments    Pt has met PT goals and is ready to DC home from PT standpoint. He ambulated 250', completed stair training, and demonstrates good understanding of HEP. Pt has made excellent progress. L knee AAROM is 20-110*.    Follow Up Recommendations  Follow surgeon's recommendation for DC plan and follow-up therapies     Equipment Recommendations  None recommended by PT    Recommendations for Other Services       Precautions / Restrictions Precautions Precautions: Fall;Knee Precaution Comments: did not use KI otday, IND SLRs Required Braces or Orthoses: Knee Immobilizer - Left Knee Immobilizer - Left: Discontinue once straight leg raise with < 10 degree lag Restrictions Weight Bearing Restrictions: No Other Position/Activity Restrictions: WBAT    Mobility  Bed Mobility Overal bed mobility: Modified Independent Bed Mobility: Supine to Sit     Supine to sit: Modified independent (Device/Increase time);HOB elevated        Transfers Overall transfer level: Needs assistance Equipment used: Rolling walker (2 wheeled) Transfers: Sit to/from Stand Sit to Stand: From elevated surface;Supervision         General transfer comment: VCs hand placement  Ambulation/Gait Ambulation/Gait assistance: Supervision Gait Distance (Feet): 250 Feet Assistive device: Rolling walker (2 wheeled) Gait Pattern/deviations: Step-to pattern;Decreased weight shift to left;Step-through pattern Gait velocity: WFL   General Gait Details: good sequencing, no loss of balance   Stairs Stairs: Yes Stairs assistance: Supervision Stair Management: Two rails;Step to pattern;Forwards Number of Stairs: 3 General stair comments: VCs sequencing   Wheelchair Mobility    Modified Rankin (Stroke  Patients Only)       Balance Overall balance assessment: Modified Independent                                          Cognition Arousal/Alertness: Awake/alert Behavior During Therapy: WFL for tasks assessed/performed Overall Cognitive Status: Within Functional Limits for tasks assessed                                        Exercises Total Joint Exercises Ankle Circles/Pumps: AROM;Both;10 reps Quad Sets: AROM;5 reps;Both Towel Squeeze: AROM;Both;5 reps;Supine Short Arc Quad: AROM;Left;10 reps;Supine Heel Slides: AROM;Left;10 reps;Supine Hip ABduction/ADduction: AROM;Left;10 reps;Supine Straight Leg Raises: AROM;Left;10 reps;Supine Long Arc Quad: AROM;Left;10 reps;Seated Knee Flexion: AROM;AAROM;Left;10 reps;Seated Goniometric ROM: 20-100* AAROM L knee    General Comments        Pertinent Vitals/Pain Pain Score: 3  Pain Location: L knee Pain Descriptors / Indicators: Sore Pain Intervention(s): Limited activity within patient's tolerance;Monitored during session;Premedicated before session;Ice applied    Home Living                      Prior Function            PT Goals (current goals can now be found in the care plan section) Acute Rehab PT Goals Patient Stated Goal: go home, do yardwork PT Goal Formulation: With patient Time For Goal Achievement: 04/18/18 Potential to Achieve Goals: Good Progress towards PT goals: Goals met/education completed, patient discharged from PT    Frequency  7X/week      PT Plan Current plan remains appropriate    Co-evaluation              AM-PAC PT "6 Clicks" Mobility   Outcome Measure  Help needed turning from your back to your side while in a flat bed without using bedrails?: None Help needed moving from lying on your back to sitting on the side of a flat bed without using bedrails?: None Help needed moving to and from a bed to a chair (including a wheelchair)?:  None Help needed standing up from a chair using your arms (e.g., wheelchair or bedside chair)?: None Help needed to walk in hospital room?: None Help needed climbing 3-5 steps with a railing? : A Little 6 Click Score: 23    End of Session Equipment Utilized During Treatment: Gait belt Activity Tolerance: Patient tolerated treatment well Patient left: in chair;with family/visitor present;with call bell/phone within reach Nurse Communication: Mobility status PT Visit Diagnosis: Difficulty in walking, not elsewhere classified (R26.2)     Time: 0177-9390 PT Time Calculation (min) (ACUTE ONLY): 39 min  Charges:  $Gait Training: 8-22 mins $Therapeutic Exercise: 8-22 mins $Therapeutic Activity: 8-22 mins                     Blondell Reveal Kistler PT 04/12/2018  Acute Rehabilitation Services Pager [phone] Office [phone]    .

## 2018-04-13 DIAGNOSIS — E119 Type 2 diabetes mellitus without complications: Secondary | ICD-10-CM | POA: Diagnosis not present

## 2018-04-13 DIAGNOSIS — I10 Essential (primary) hypertension: Secondary | ICD-10-CM | POA: Diagnosis not present

## 2018-04-13 DIAGNOSIS — Z87891 Personal history of nicotine dependence: Secondary | ICD-10-CM | POA: Diagnosis not present

## 2018-04-13 DIAGNOSIS — Z471 Aftercare following joint replacement surgery: Secondary | ICD-10-CM | POA: Diagnosis not present

## 2018-04-13 DIAGNOSIS — K5900 Constipation, unspecified: Secondary | ICD-10-CM | POA: Diagnosis not present

## 2018-04-13 DIAGNOSIS — Z96653 Presence of artificial knee joint, bilateral: Secondary | ICD-10-CM | POA: Diagnosis not present

## 2018-04-13 DIAGNOSIS — K219 Gastro-esophageal reflux disease without esophagitis: Secondary | ICD-10-CM | POA: Diagnosis not present

## 2018-04-13 DIAGNOSIS — Z7984 Long term (current) use of oral hypoglycemic drugs: Secondary | ICD-10-CM | POA: Diagnosis not present

## 2018-04-14 DIAGNOSIS — E119 Type 2 diabetes mellitus without complications: Secondary | ICD-10-CM | POA: Diagnosis not present

## 2018-04-14 DIAGNOSIS — Z96653 Presence of artificial knee joint, bilateral: Secondary | ICD-10-CM | POA: Diagnosis not present

## 2018-04-14 DIAGNOSIS — Z7984 Long term (current) use of oral hypoglycemic drugs: Secondary | ICD-10-CM | POA: Diagnosis not present

## 2018-04-14 DIAGNOSIS — K219 Gastro-esophageal reflux disease without esophagitis: Secondary | ICD-10-CM | POA: Diagnosis not present

## 2018-04-14 DIAGNOSIS — Z87891 Personal history of nicotine dependence: Secondary | ICD-10-CM | POA: Diagnosis not present

## 2018-04-14 DIAGNOSIS — I10 Essential (primary) hypertension: Secondary | ICD-10-CM | POA: Diagnosis not present

## 2018-04-14 DIAGNOSIS — Z471 Aftercare following joint replacement surgery: Secondary | ICD-10-CM | POA: Diagnosis not present

## 2018-04-14 DIAGNOSIS — K5900 Constipation, unspecified: Secondary | ICD-10-CM | POA: Diagnosis not present

## 2018-04-18 DIAGNOSIS — Z471 Aftercare following joint replacement surgery: Secondary | ICD-10-CM | POA: Diagnosis not present

## 2018-04-18 DIAGNOSIS — K219 Gastro-esophageal reflux disease without esophagitis: Secondary | ICD-10-CM | POA: Diagnosis not present

## 2018-04-18 DIAGNOSIS — Z96653 Presence of artificial knee joint, bilateral: Secondary | ICD-10-CM | POA: Diagnosis not present

## 2018-04-18 DIAGNOSIS — E119 Type 2 diabetes mellitus without complications: Secondary | ICD-10-CM | POA: Diagnosis not present

## 2018-04-18 DIAGNOSIS — Z7984 Long term (current) use of oral hypoglycemic drugs: Secondary | ICD-10-CM | POA: Diagnosis not present

## 2018-04-18 DIAGNOSIS — Z87891 Personal history of nicotine dependence: Secondary | ICD-10-CM | POA: Diagnosis not present

## 2018-04-18 DIAGNOSIS — K5900 Constipation, unspecified: Secondary | ICD-10-CM | POA: Diagnosis not present

## 2018-04-18 DIAGNOSIS — I10 Essential (primary) hypertension: Secondary | ICD-10-CM | POA: Diagnosis not present

## 2018-04-20 DIAGNOSIS — Z96653 Presence of artificial knee joint, bilateral: Secondary | ICD-10-CM | POA: Diagnosis not present

## 2018-04-20 DIAGNOSIS — Z87891 Personal history of nicotine dependence: Secondary | ICD-10-CM | POA: Diagnosis not present

## 2018-04-20 DIAGNOSIS — I10 Essential (primary) hypertension: Secondary | ICD-10-CM | POA: Diagnosis not present

## 2018-04-20 DIAGNOSIS — Z7984 Long term (current) use of oral hypoglycemic drugs: Secondary | ICD-10-CM | POA: Diagnosis not present

## 2018-04-20 DIAGNOSIS — K5900 Constipation, unspecified: Secondary | ICD-10-CM | POA: Diagnosis not present

## 2018-04-20 DIAGNOSIS — E119 Type 2 diabetes mellitus without complications: Secondary | ICD-10-CM | POA: Diagnosis not present

## 2018-04-20 DIAGNOSIS — K219 Gastro-esophageal reflux disease without esophagitis: Secondary | ICD-10-CM | POA: Diagnosis not present

## 2018-04-20 DIAGNOSIS — Z471 Aftercare following joint replacement surgery: Secondary | ICD-10-CM | POA: Diagnosis not present

## 2018-04-22 DIAGNOSIS — E119 Type 2 diabetes mellitus without complications: Secondary | ICD-10-CM | POA: Diagnosis not present

## 2018-04-22 DIAGNOSIS — Z471 Aftercare following joint replacement surgery: Secondary | ICD-10-CM | POA: Diagnosis not present

## 2018-04-22 DIAGNOSIS — Z7984 Long term (current) use of oral hypoglycemic drugs: Secondary | ICD-10-CM | POA: Diagnosis not present

## 2018-04-22 DIAGNOSIS — Z96653 Presence of artificial knee joint, bilateral: Secondary | ICD-10-CM | POA: Diagnosis not present

## 2018-04-22 DIAGNOSIS — I10 Essential (primary) hypertension: Secondary | ICD-10-CM | POA: Diagnosis not present

## 2018-04-22 DIAGNOSIS — K219 Gastro-esophageal reflux disease without esophagitis: Secondary | ICD-10-CM | POA: Diagnosis not present

## 2018-04-22 DIAGNOSIS — Z87891 Personal history of nicotine dependence: Secondary | ICD-10-CM | POA: Diagnosis not present

## 2018-04-22 DIAGNOSIS — K5900 Constipation, unspecified: Secondary | ICD-10-CM | POA: Diagnosis not present

## 2018-04-25 DIAGNOSIS — Z471 Aftercare following joint replacement surgery: Secondary | ICD-10-CM | POA: Diagnosis not present

## 2018-04-25 DIAGNOSIS — M1712 Unilateral primary osteoarthritis, left knee: Secondary | ICD-10-CM | POA: Diagnosis not present

## 2018-04-25 DIAGNOSIS — R262 Difficulty in walking, not elsewhere classified: Secondary | ICD-10-CM | POA: Diagnosis not present

## 2018-04-25 DIAGNOSIS — M6281 Muscle weakness (generalized): Secondary | ICD-10-CM | POA: Diagnosis not present

## 2018-04-29 DIAGNOSIS — R262 Difficulty in walking, not elsewhere classified: Secondary | ICD-10-CM | POA: Diagnosis not present

## 2018-04-29 DIAGNOSIS — M1712 Unilateral primary osteoarthritis, left knee: Secondary | ICD-10-CM | POA: Diagnosis not present

## 2018-04-29 DIAGNOSIS — Z471 Aftercare following joint replacement surgery: Secondary | ICD-10-CM | POA: Diagnosis not present

## 2018-04-29 DIAGNOSIS — M6281 Muscle weakness (generalized): Secondary | ICD-10-CM | POA: Diagnosis not present

## 2018-05-02 DIAGNOSIS — R262 Difficulty in walking, not elsewhere classified: Secondary | ICD-10-CM | POA: Diagnosis not present

## 2018-05-02 DIAGNOSIS — M6281 Muscle weakness (generalized): Secondary | ICD-10-CM | POA: Diagnosis not present

## 2018-05-02 DIAGNOSIS — Z471 Aftercare following joint replacement surgery: Secondary | ICD-10-CM | POA: Diagnosis not present

## 2018-05-02 DIAGNOSIS — M1712 Unilateral primary osteoarthritis, left knee: Secondary | ICD-10-CM | POA: Diagnosis not present

## 2018-05-04 DIAGNOSIS — M6281 Muscle weakness (generalized): Secondary | ICD-10-CM | POA: Diagnosis not present

## 2018-05-04 DIAGNOSIS — R262 Difficulty in walking, not elsewhere classified: Secondary | ICD-10-CM | POA: Diagnosis not present

## 2018-05-04 DIAGNOSIS — Z471 Aftercare following joint replacement surgery: Secondary | ICD-10-CM | POA: Diagnosis not present

## 2018-05-04 DIAGNOSIS — M1712 Unilateral primary osteoarthritis, left knee: Secondary | ICD-10-CM | POA: Diagnosis not present

## 2018-05-06 DIAGNOSIS — M6281 Muscle weakness (generalized): Secondary | ICD-10-CM | POA: Diagnosis not present

## 2018-05-06 DIAGNOSIS — R262 Difficulty in walking, not elsewhere classified: Secondary | ICD-10-CM | POA: Diagnosis not present

## 2018-05-06 DIAGNOSIS — M1712 Unilateral primary osteoarthritis, left knee: Secondary | ICD-10-CM | POA: Diagnosis not present

## 2018-05-09 DIAGNOSIS — R262 Difficulty in walking, not elsewhere classified: Secondary | ICD-10-CM | POA: Diagnosis not present

## 2018-05-09 DIAGNOSIS — Z471 Aftercare following joint replacement surgery: Secondary | ICD-10-CM | POA: Diagnosis not present

## 2018-05-09 DIAGNOSIS — M6281 Muscle weakness (generalized): Secondary | ICD-10-CM | POA: Diagnosis not present

## 2018-05-09 DIAGNOSIS — M1712 Unilateral primary osteoarthritis, left knee: Secondary | ICD-10-CM | POA: Diagnosis not present

## 2018-05-11 DIAGNOSIS — Z471 Aftercare following joint replacement surgery: Secondary | ICD-10-CM | POA: Diagnosis not present

## 2018-05-11 DIAGNOSIS — M1712 Unilateral primary osteoarthritis, left knee: Secondary | ICD-10-CM | POA: Diagnosis not present

## 2018-05-11 DIAGNOSIS — R262 Difficulty in walking, not elsewhere classified: Secondary | ICD-10-CM | POA: Diagnosis not present

## 2018-05-11 DIAGNOSIS — M6281 Muscle weakness (generalized): Secondary | ICD-10-CM | POA: Diagnosis not present

## 2018-05-13 DIAGNOSIS — R262 Difficulty in walking, not elsewhere classified: Secondary | ICD-10-CM | POA: Diagnosis not present

## 2018-05-13 DIAGNOSIS — M1712 Unilateral primary osteoarthritis, left knee: Secondary | ICD-10-CM | POA: Diagnosis not present

## 2018-05-13 DIAGNOSIS — M6281 Muscle weakness (generalized): Secondary | ICD-10-CM | POA: Diagnosis not present

## 2018-05-16 DIAGNOSIS — M6281 Muscle weakness (generalized): Secondary | ICD-10-CM | POA: Diagnosis not present

## 2018-05-16 DIAGNOSIS — R262 Difficulty in walking, not elsewhere classified: Secondary | ICD-10-CM | POA: Diagnosis not present

## 2018-05-16 DIAGNOSIS — M1712 Unilateral primary osteoarthritis, left knee: Secondary | ICD-10-CM | POA: Diagnosis not present

## 2018-05-16 DIAGNOSIS — Z471 Aftercare following joint replacement surgery: Secondary | ICD-10-CM | POA: Diagnosis not present

## 2018-05-18 DIAGNOSIS — Z471 Aftercare following joint replacement surgery: Secondary | ICD-10-CM | POA: Diagnosis not present

## 2018-05-18 DIAGNOSIS — R262 Difficulty in walking, not elsewhere classified: Secondary | ICD-10-CM | POA: Diagnosis not present

## 2018-05-18 DIAGNOSIS — M1712 Unilateral primary osteoarthritis, left knee: Secondary | ICD-10-CM | POA: Diagnosis not present

## 2018-05-18 DIAGNOSIS — M6281 Muscle weakness (generalized): Secondary | ICD-10-CM | POA: Diagnosis not present

## 2018-05-20 DIAGNOSIS — Z471 Aftercare following joint replacement surgery: Secondary | ICD-10-CM | POA: Diagnosis not present

## 2018-05-20 DIAGNOSIS — M6281 Muscle weakness (generalized): Secondary | ICD-10-CM | POA: Diagnosis not present

## 2018-05-20 DIAGNOSIS — R262 Difficulty in walking, not elsewhere classified: Secondary | ICD-10-CM | POA: Diagnosis not present

## 2018-05-20 DIAGNOSIS — M1712 Unilateral primary osteoarthritis, left knee: Secondary | ICD-10-CM | POA: Diagnosis not present

## 2018-05-23 DIAGNOSIS — M1712 Unilateral primary osteoarthritis, left knee: Secondary | ICD-10-CM | POA: Diagnosis not present

## 2018-05-23 DIAGNOSIS — M6281 Muscle weakness (generalized): Secondary | ICD-10-CM | POA: Diagnosis not present

## 2018-05-23 DIAGNOSIS — R262 Difficulty in walking, not elsewhere classified: Secondary | ICD-10-CM | POA: Diagnosis not present

## 2018-05-25 DIAGNOSIS — M6281 Muscle weakness (generalized): Secondary | ICD-10-CM | POA: Diagnosis not present

## 2018-05-25 DIAGNOSIS — M1712 Unilateral primary osteoarthritis, left knee: Secondary | ICD-10-CM | POA: Diagnosis not present

## 2018-05-25 DIAGNOSIS — R262 Difficulty in walking, not elsewhere classified: Secondary | ICD-10-CM | POA: Diagnosis not present

## 2018-05-30 DIAGNOSIS — M1712 Unilateral primary osteoarthritis, left knee: Secondary | ICD-10-CM | POA: Diagnosis not present

## 2018-05-30 DIAGNOSIS — M6281 Muscle weakness (generalized): Secondary | ICD-10-CM | POA: Diagnosis not present

## 2018-05-30 DIAGNOSIS — Z471 Aftercare following joint replacement surgery: Secondary | ICD-10-CM | POA: Diagnosis not present

## 2018-05-30 DIAGNOSIS — R262 Difficulty in walking, not elsewhere classified: Secondary | ICD-10-CM | POA: Diagnosis not present

## 2018-06-01 DIAGNOSIS — Z471 Aftercare following joint replacement surgery: Secondary | ICD-10-CM | POA: Diagnosis not present

## 2018-06-01 DIAGNOSIS — M1712 Unilateral primary osteoarthritis, left knee: Secondary | ICD-10-CM | POA: Diagnosis not present

## 2018-06-01 DIAGNOSIS — M6281 Muscle weakness (generalized): Secondary | ICD-10-CM | POA: Diagnosis not present

## 2018-06-01 DIAGNOSIS — R262 Difficulty in walking, not elsewhere classified: Secondary | ICD-10-CM | POA: Diagnosis not present

## 2018-06-02 DIAGNOSIS — I10 Essential (primary) hypertension: Secondary | ICD-10-CM | POA: Diagnosis not present

## 2018-06-02 DIAGNOSIS — E7800 Pure hypercholesterolemia, unspecified: Secondary | ICD-10-CM | POA: Diagnosis not present

## 2018-06-02 DIAGNOSIS — Z7984 Long term (current) use of oral hypoglycemic drugs: Secondary | ICD-10-CM | POA: Diagnosis not present

## 2018-06-02 DIAGNOSIS — E1169 Type 2 diabetes mellitus with other specified complication: Secondary | ICD-10-CM | POA: Diagnosis not present

## 2018-06-02 DIAGNOSIS — K219 Gastro-esophageal reflux disease without esophagitis: Secondary | ICD-10-CM | POA: Diagnosis not present

## 2018-06-03 DIAGNOSIS — E1169 Type 2 diabetes mellitus with other specified complication: Secondary | ICD-10-CM | POA: Diagnosis not present

## 2018-06-06 DIAGNOSIS — M1712 Unilateral primary osteoarthritis, left knee: Secondary | ICD-10-CM | POA: Diagnosis not present

## 2018-06-06 DIAGNOSIS — Z471 Aftercare following joint replacement surgery: Secondary | ICD-10-CM | POA: Diagnosis not present

## 2018-06-06 DIAGNOSIS — M6281 Muscle weakness (generalized): Secondary | ICD-10-CM | POA: Diagnosis not present

## 2018-06-06 DIAGNOSIS — R262 Difficulty in walking, not elsewhere classified: Secondary | ICD-10-CM | POA: Diagnosis not present

## 2018-06-07 DIAGNOSIS — Z96652 Presence of left artificial knee joint: Secondary | ICD-10-CM | POA: Diagnosis not present

## 2018-06-08 DIAGNOSIS — R262 Difficulty in walking, not elsewhere classified: Secondary | ICD-10-CM | POA: Diagnosis not present

## 2018-06-08 DIAGNOSIS — M6281 Muscle weakness (generalized): Secondary | ICD-10-CM | POA: Diagnosis not present

## 2018-06-08 DIAGNOSIS — Z471 Aftercare following joint replacement surgery: Secondary | ICD-10-CM | POA: Diagnosis not present

## 2018-06-08 DIAGNOSIS — M1712 Unilateral primary osteoarthritis, left knee: Secondary | ICD-10-CM | POA: Diagnosis not present

## 2018-06-13 DIAGNOSIS — M1712 Unilateral primary osteoarthritis, left knee: Secondary | ICD-10-CM | POA: Diagnosis not present

## 2018-07-07 DIAGNOSIS — Z96652 Presence of left artificial knee joint: Secondary | ICD-10-CM | POA: Diagnosis not present

## 2018-07-12 DIAGNOSIS — M1712 Unilateral primary osteoarthritis, left knee: Secondary | ICD-10-CM | POA: Diagnosis not present

## 2018-08-03 DIAGNOSIS — H40011 Open angle with borderline findings, low risk, right eye: Secondary | ICD-10-CM | POA: Diagnosis not present

## 2018-08-03 DIAGNOSIS — H401121 Primary open-angle glaucoma, left eye, mild stage: Secondary | ICD-10-CM | POA: Diagnosis not present

## 2018-08-07 DIAGNOSIS — Z96652 Presence of left artificial knee joint: Secondary | ICD-10-CM | POA: Diagnosis not present

## 2018-09-06 DIAGNOSIS — Z96652 Presence of left artificial knee joint: Secondary | ICD-10-CM | POA: Diagnosis not present

## 2018-09-12 ENCOUNTER — Other Ambulatory Visit: Payer: Self-pay

## 2018-09-12 DIAGNOSIS — R6889 Other general symptoms and signs: Secondary | ICD-10-CM | POA: Diagnosis not present

## 2018-09-12 DIAGNOSIS — Z20822 Contact with and (suspected) exposure to covid-19: Secondary | ICD-10-CM

## 2018-09-12 DIAGNOSIS — R972 Elevated prostate specific antigen [PSA]: Secondary | ICD-10-CM | POA: Diagnosis not present

## 2018-09-14 LAB — NOVEL CORONAVIRUS, NAA: SARS-CoV-2, NAA: NOT DETECTED

## 2018-09-22 DIAGNOSIS — N5201 Erectile dysfunction due to arterial insufficiency: Secondary | ICD-10-CM | POA: Diagnosis not present

## 2018-10-11 DIAGNOSIS — M1712 Unilateral primary osteoarthritis, left knee: Secondary | ICD-10-CM | POA: Diagnosis not present

## 2018-11-11 DIAGNOSIS — E1165 Type 2 diabetes mellitus with hyperglycemia: Secondary | ICD-10-CM | POA: Diagnosis not present

## 2018-11-11 DIAGNOSIS — M1712 Unilateral primary osteoarthritis, left knee: Secondary | ICD-10-CM | POA: Diagnosis not present

## 2018-11-11 DIAGNOSIS — M170 Bilateral primary osteoarthritis of knee: Secondary | ICD-10-CM | POA: Diagnosis not present

## 2018-11-11 DIAGNOSIS — E7800 Pure hypercholesterolemia, unspecified: Secondary | ICD-10-CM | POA: Diagnosis not present

## 2018-11-11 DIAGNOSIS — E1169 Type 2 diabetes mellitus with other specified complication: Secondary | ICD-10-CM | POA: Diagnosis not present

## 2018-11-11 DIAGNOSIS — I10 Essential (primary) hypertension: Secondary | ICD-10-CM | POA: Diagnosis not present

## 2018-12-26 DIAGNOSIS — E7800 Pure hypercholesterolemia, unspecified: Secondary | ICD-10-CM | POA: Diagnosis not present

## 2018-12-26 DIAGNOSIS — M170 Bilateral primary osteoarthritis of knee: Secondary | ICD-10-CM | POA: Diagnosis not present

## 2018-12-26 DIAGNOSIS — E1169 Type 2 diabetes mellitus with other specified complication: Secondary | ICD-10-CM | POA: Diagnosis not present

## 2018-12-26 DIAGNOSIS — Z1389 Encounter for screening for other disorder: Secondary | ICD-10-CM | POA: Diagnosis not present

## 2018-12-26 DIAGNOSIS — I10 Essential (primary) hypertension: Secondary | ICD-10-CM | POA: Diagnosis not present

## 2018-12-26 DIAGNOSIS — K219 Gastro-esophageal reflux disease without esophagitis: Secondary | ICD-10-CM | POA: Diagnosis not present

## 2018-12-26 DIAGNOSIS — Z0000 Encounter for general adult medical examination without abnormal findings: Secondary | ICD-10-CM | POA: Diagnosis not present

## 2018-12-30 DIAGNOSIS — E1169 Type 2 diabetes mellitus with other specified complication: Secondary | ICD-10-CM | POA: Diagnosis not present

## 2018-12-30 DIAGNOSIS — I10 Essential (primary) hypertension: Secondary | ICD-10-CM | POA: Diagnosis not present

## 2018-12-30 DIAGNOSIS — E7800 Pure hypercholesterolemia, unspecified: Secondary | ICD-10-CM | POA: Diagnosis not present

## 2019-01-08 IMAGING — DX DG CHEST 2V
3 series · 3 of 3 positions shown · non-contrast
Comparison: No recent.

CLINICAL DATA: Respiratory tract infection.  Cough.

EXAM:
CHEST  2 VIEW

[dg chest 2 view (1 of 3)]
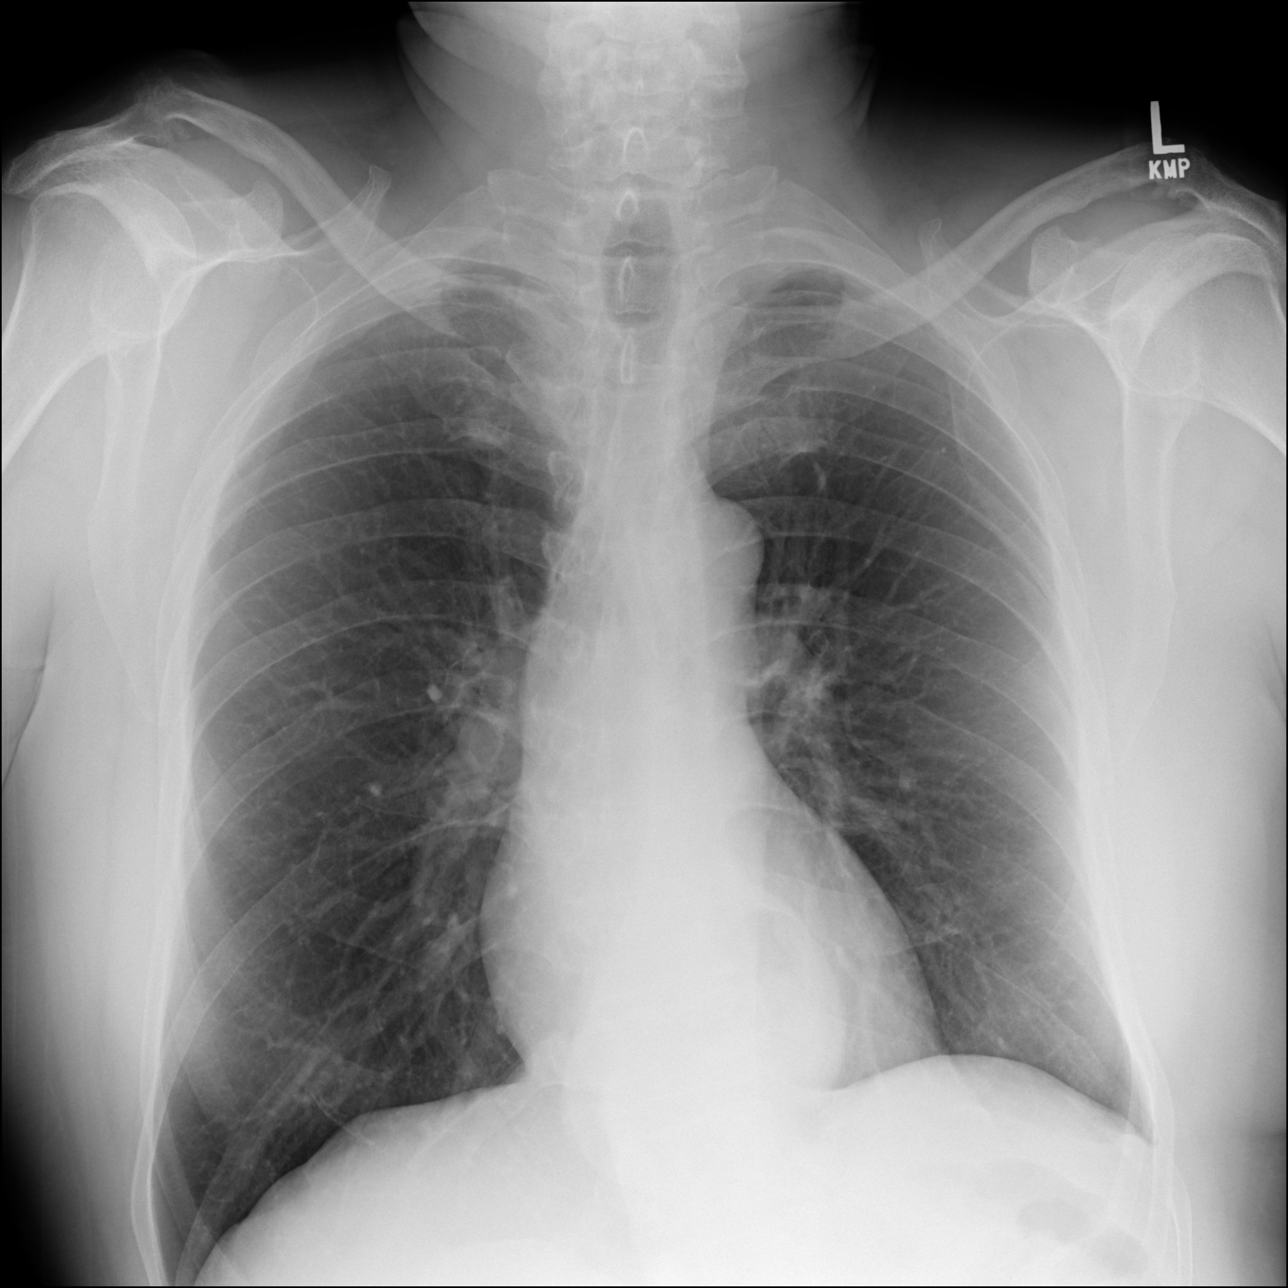

[dg chest 2 view (2 of 3)]
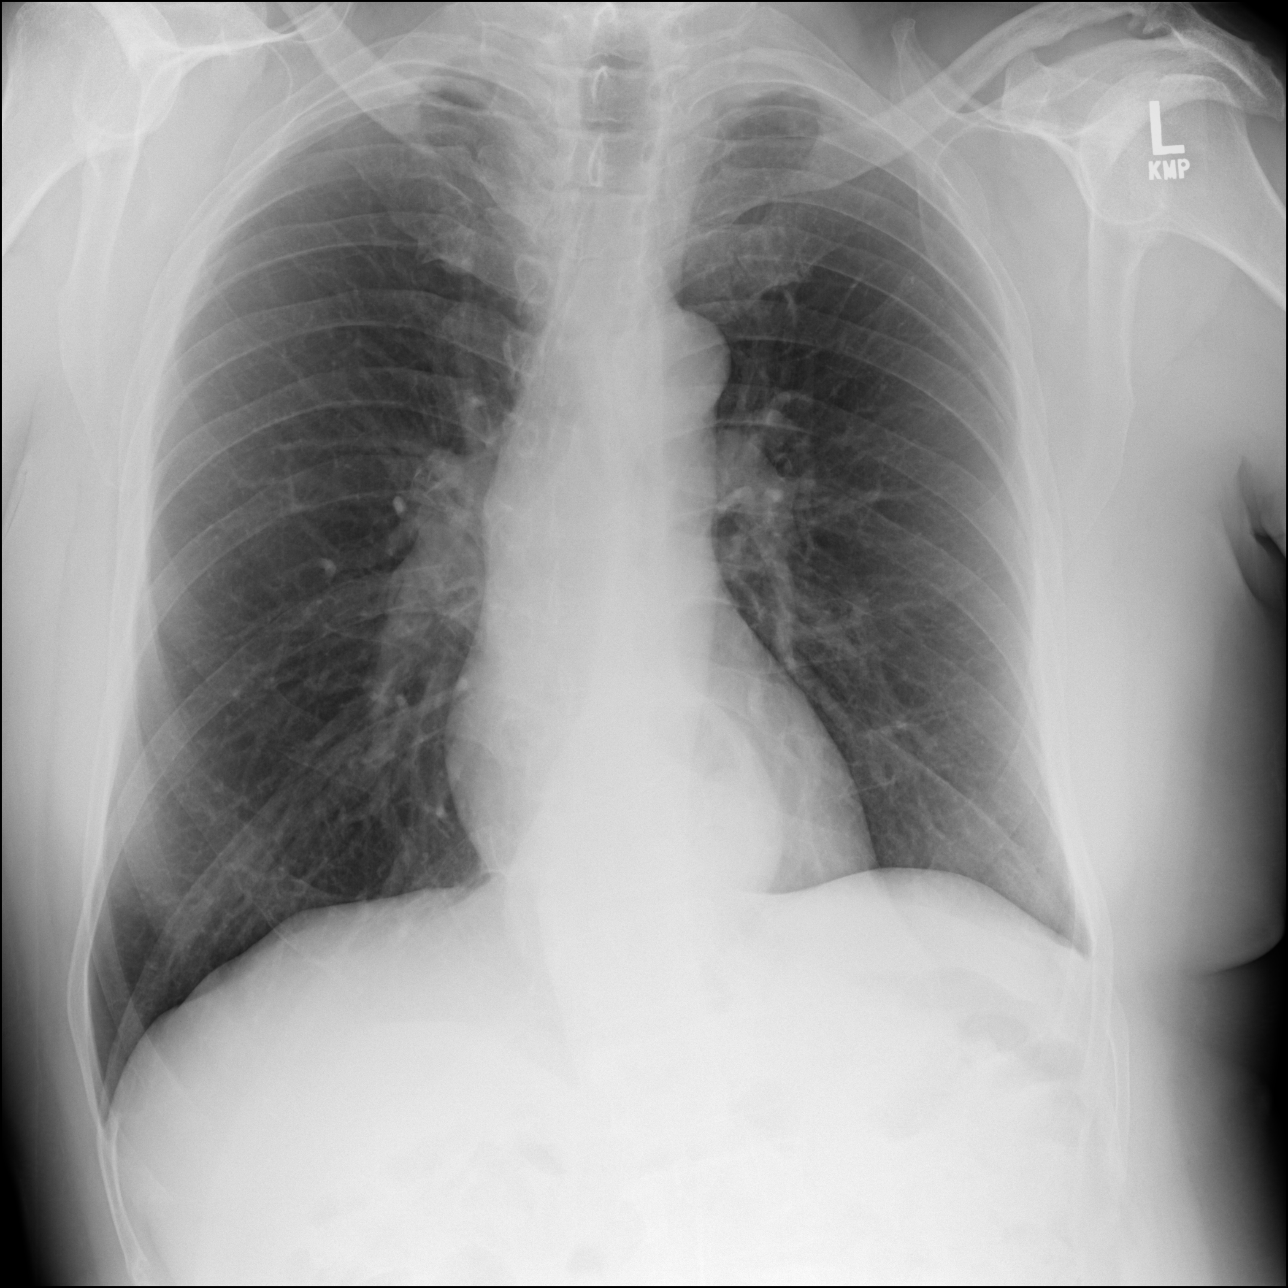

[dg chest 2 view (3 of 3)]
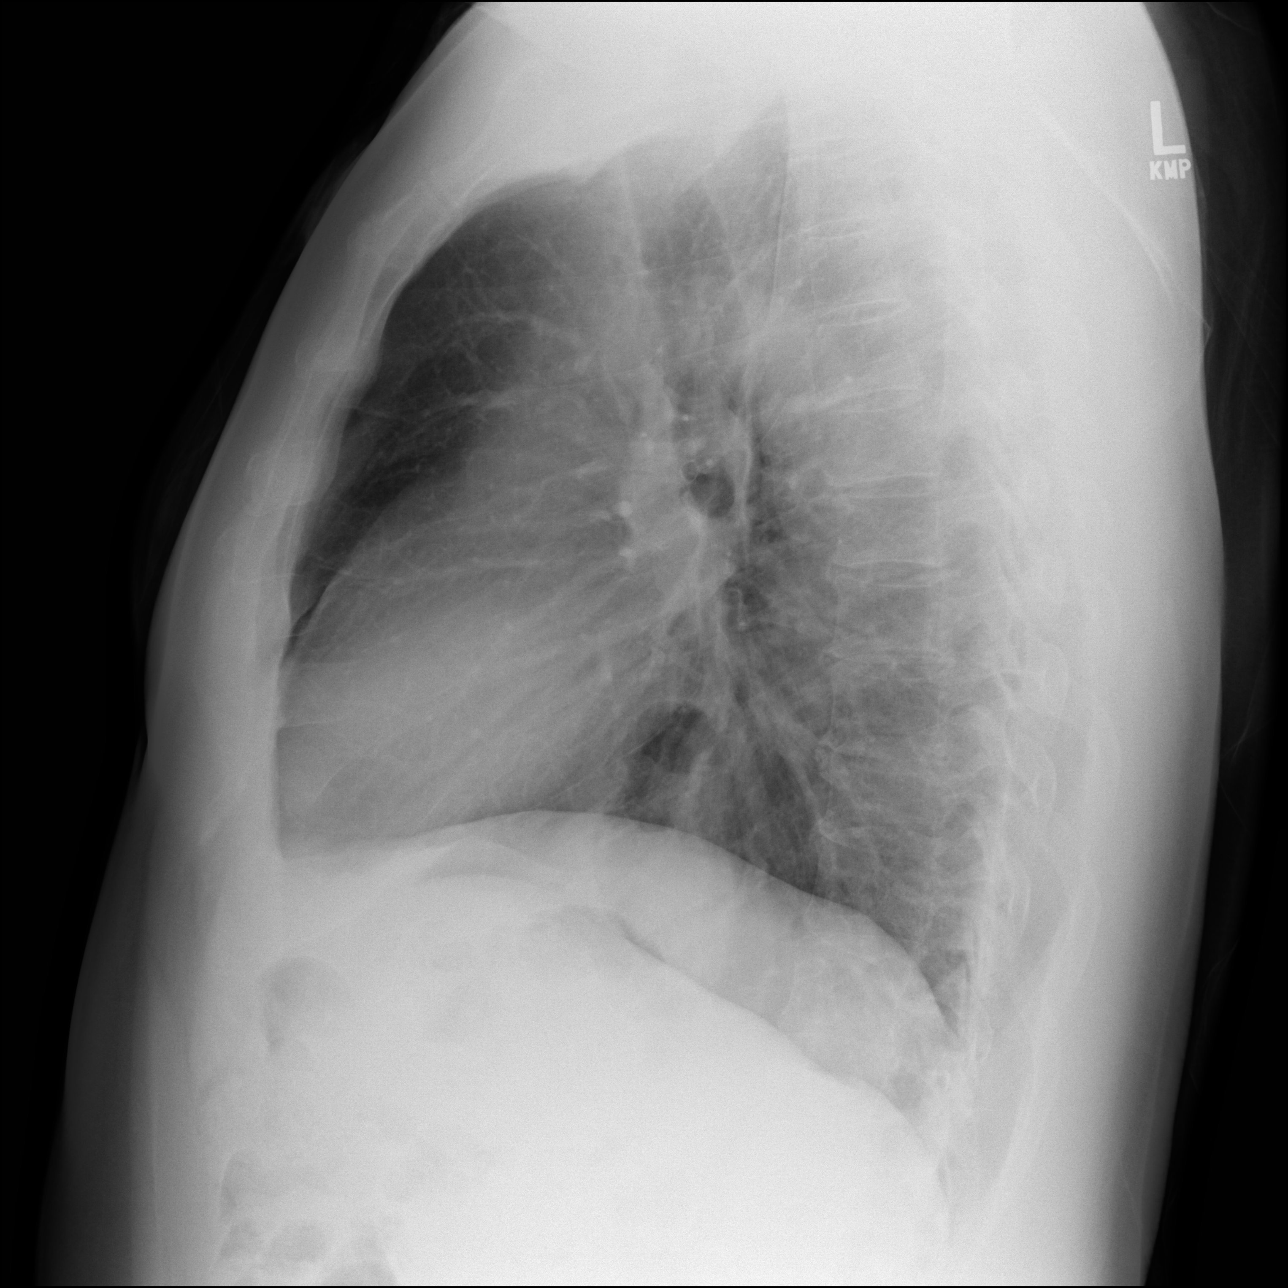

[3 of 3 positions shown; findings below may reference images not displayed]

FINDINGS: Mediastinum hilar structures normal. Lungs are clear. Heart size
normal. No pleural effusion or pneumothorax. Sliding hiatal hernia.
Degenerative changes thoracic spine.
IMPRESSION: 1.  No acute cardiopulmonary disease.

2. Sliding hiatal hernia .

## 2019-02-03 DIAGNOSIS — H401121 Primary open-angle glaucoma, left eye, mild stage: Secondary | ICD-10-CM | POA: Diagnosis not present

## 2019-02-03 DIAGNOSIS — H35373 Puckering of macula, bilateral: Secondary | ICD-10-CM | POA: Diagnosis not present

## 2019-02-03 DIAGNOSIS — H40011 Open angle with borderline findings, low risk, right eye: Secondary | ICD-10-CM | POA: Diagnosis not present

## 2019-02-03 DIAGNOSIS — E119 Type 2 diabetes mellitus without complications: Secondary | ICD-10-CM | POA: Diagnosis not present

## 2019-02-15 DIAGNOSIS — I10 Essential (primary) hypertension: Secondary | ICD-10-CM | POA: Diagnosis not present

## 2019-02-15 DIAGNOSIS — E1165 Type 2 diabetes mellitus with hyperglycemia: Secondary | ICD-10-CM | POA: Diagnosis not present

## 2019-02-15 DIAGNOSIS — M1712 Unilateral primary osteoarthritis, left knee: Secondary | ICD-10-CM | POA: Diagnosis not present

## 2019-02-15 DIAGNOSIS — E7800 Pure hypercholesterolemia, unspecified: Secondary | ICD-10-CM | POA: Diagnosis not present

## 2019-02-15 DIAGNOSIS — M170 Bilateral primary osteoarthritis of knee: Secondary | ICD-10-CM | POA: Diagnosis not present

## 2019-02-15 DIAGNOSIS — E1169 Type 2 diabetes mellitus with other specified complication: Secondary | ICD-10-CM | POA: Diagnosis not present

## 2019-04-10 DIAGNOSIS — Z96653 Presence of artificial knee joint, bilateral: Secondary | ICD-10-CM | POA: Diagnosis not present

## 2019-04-10 DIAGNOSIS — M76892 Other specified enthesopathies of left lower limb, excluding foot: Secondary | ICD-10-CM | POA: Diagnosis not present

## 2019-04-12 DIAGNOSIS — E7800 Pure hypercholesterolemia, unspecified: Secondary | ICD-10-CM | POA: Diagnosis not present

## 2019-04-12 DIAGNOSIS — M1712 Unilateral primary osteoarthritis, left knee: Secondary | ICD-10-CM | POA: Diagnosis not present

## 2019-04-12 DIAGNOSIS — M170 Bilateral primary osteoarthritis of knee: Secondary | ICD-10-CM | POA: Diagnosis not present

## 2019-04-12 DIAGNOSIS — E1169 Type 2 diabetes mellitus with other specified complication: Secondary | ICD-10-CM | POA: Diagnosis not present

## 2019-04-12 DIAGNOSIS — I10 Essential (primary) hypertension: Secondary | ICD-10-CM | POA: Diagnosis not present

## 2019-04-12 DIAGNOSIS — E1165 Type 2 diabetes mellitus with hyperglycemia: Secondary | ICD-10-CM | POA: Diagnosis not present

## 2019-04-25 DIAGNOSIS — Z86010 Personal history of colonic polyps: Secondary | ICD-10-CM | POA: Diagnosis not present

## 2019-04-25 DIAGNOSIS — R1310 Dysphagia, unspecified: Secondary | ICD-10-CM | POA: Diagnosis not present

## 2019-06-10 DIAGNOSIS — M66821 Spontaneous rupture of other tendons, right upper arm: Secondary | ICD-10-CM | POA: Diagnosis not present

## 2019-06-10 DIAGNOSIS — M25511 Pain in right shoulder: Secondary | ICD-10-CM | POA: Diagnosis not present

## 2019-06-12 DIAGNOSIS — E1169 Type 2 diabetes mellitus with other specified complication: Secondary | ICD-10-CM | POA: Diagnosis not present

## 2019-06-12 DIAGNOSIS — E7800 Pure hypercholesterolemia, unspecified: Secondary | ICD-10-CM | POA: Diagnosis not present

## 2019-06-12 DIAGNOSIS — M170 Bilateral primary osteoarthritis of knee: Secondary | ICD-10-CM | POA: Diagnosis not present

## 2019-06-12 DIAGNOSIS — E1165 Type 2 diabetes mellitus with hyperglycemia: Secondary | ICD-10-CM | POA: Diagnosis not present

## 2019-06-12 DIAGNOSIS — M1712 Unilateral primary osteoarthritis, left knee: Secondary | ICD-10-CM | POA: Diagnosis not present

## 2019-06-12 DIAGNOSIS — I10 Essential (primary) hypertension: Secondary | ICD-10-CM | POA: Diagnosis not present

## 2019-06-26 DIAGNOSIS — K219 Gastro-esophageal reflux disease without esophagitis: Secondary | ICD-10-CM | POA: Diagnosis not present

## 2019-06-26 DIAGNOSIS — M25511 Pain in right shoulder: Secondary | ICD-10-CM | POA: Diagnosis not present

## 2019-06-26 DIAGNOSIS — I700 Atherosclerosis of aorta: Secondary | ICD-10-CM | POA: Diagnosis not present

## 2019-06-26 DIAGNOSIS — E1169 Type 2 diabetes mellitus with other specified complication: Secondary | ICD-10-CM | POA: Diagnosis not present

## 2019-06-26 DIAGNOSIS — M7501 Adhesive capsulitis of right shoulder: Secondary | ICD-10-CM | POA: Diagnosis not present

## 2019-06-26 DIAGNOSIS — S46011A Strain of muscle(s) and tendon(s) of the rotator cuff of right shoulder, initial encounter: Secondary | ICD-10-CM | POA: Diagnosis not present

## 2019-06-26 DIAGNOSIS — M170 Bilateral primary osteoarthritis of knee: Secondary | ICD-10-CM | POA: Diagnosis not present

## 2019-06-26 DIAGNOSIS — I10 Essential (primary) hypertension: Secondary | ICD-10-CM | POA: Diagnosis not present

## 2019-06-26 DIAGNOSIS — E7800 Pure hypercholesterolemia, unspecified: Secondary | ICD-10-CM | POA: Diagnosis not present

## 2019-07-04 DIAGNOSIS — M25511 Pain in right shoulder: Secondary | ICD-10-CM | POA: Diagnosis not present

## 2019-07-10 DIAGNOSIS — M25511 Pain in right shoulder: Secondary | ICD-10-CM | POA: Diagnosis not present

## 2019-07-14 DIAGNOSIS — G8918 Other acute postprocedural pain: Secondary | ICD-10-CM | POA: Diagnosis not present

## 2019-07-14 DIAGNOSIS — M7541 Impingement syndrome of right shoulder: Secondary | ICD-10-CM | POA: Diagnosis not present

## 2019-07-14 DIAGNOSIS — M7551 Bursitis of right shoulder: Secondary | ICD-10-CM | POA: Diagnosis not present

## 2019-07-14 DIAGNOSIS — S43491A Other sprain of right shoulder joint, initial encounter: Secondary | ICD-10-CM | POA: Diagnosis not present

## 2019-07-14 DIAGNOSIS — M19011 Primary osteoarthritis, right shoulder: Secondary | ICD-10-CM | POA: Diagnosis not present

## 2019-07-14 DIAGNOSIS — M24111 Other articular cartilage disorders, right shoulder: Secondary | ICD-10-CM | POA: Diagnosis not present

## 2019-07-14 DIAGNOSIS — S46011A Strain of muscle(s) and tendon(s) of the rotator cuff of right shoulder, initial encounter: Secondary | ICD-10-CM | POA: Diagnosis not present

## 2019-07-14 DIAGNOSIS — S46291A Other injury of muscle, fascia and tendon of other parts of biceps, right arm, initial encounter: Secondary | ICD-10-CM | POA: Diagnosis not present

## 2019-07-18 DIAGNOSIS — M25511 Pain in right shoulder: Secondary | ICD-10-CM | POA: Diagnosis not present

## 2019-07-24 DIAGNOSIS — S46011D Strain of muscle(s) and tendon(s) of the rotator cuff of right shoulder, subsequent encounter: Secondary | ICD-10-CM | POA: Diagnosis not present

## 2019-07-24 DIAGNOSIS — M19011 Primary osteoarthritis, right shoulder: Secondary | ICD-10-CM | POA: Diagnosis not present

## 2019-07-24 DIAGNOSIS — M25511 Pain in right shoulder: Secondary | ICD-10-CM | POA: Diagnosis not present

## 2019-07-24 DIAGNOSIS — M6281 Muscle weakness (generalized): Secondary | ICD-10-CM | POA: Diagnosis not present

## 2019-07-24 DIAGNOSIS — S43431D Superior glenoid labrum lesion of right shoulder, subsequent encounter: Secondary | ICD-10-CM | POA: Diagnosis not present

## 2019-07-26 DIAGNOSIS — M6281 Muscle weakness (generalized): Secondary | ICD-10-CM | POA: Diagnosis not present

## 2019-07-26 DIAGNOSIS — M19011 Primary osteoarthritis, right shoulder: Secondary | ICD-10-CM | POA: Diagnosis not present

## 2019-07-26 DIAGNOSIS — S46011D Strain of muscle(s) and tendon(s) of the rotator cuff of right shoulder, subsequent encounter: Secondary | ICD-10-CM | POA: Diagnosis not present

## 2019-07-26 DIAGNOSIS — S43431D Superior glenoid labrum lesion of right shoulder, subsequent encounter: Secondary | ICD-10-CM | POA: Diagnosis not present

## 2019-08-01 DIAGNOSIS — S46011D Strain of muscle(s) and tendon(s) of the rotator cuff of right shoulder, subsequent encounter: Secondary | ICD-10-CM | POA: Diagnosis not present

## 2019-08-01 DIAGNOSIS — M6281 Muscle weakness (generalized): Secondary | ICD-10-CM | POA: Diagnosis not present

## 2019-08-01 DIAGNOSIS — S43431D Superior glenoid labrum lesion of right shoulder, subsequent encounter: Secondary | ICD-10-CM | POA: Diagnosis not present

## 2019-08-01 DIAGNOSIS — M19011 Primary osteoarthritis, right shoulder: Secondary | ICD-10-CM | POA: Diagnosis not present

## 2019-08-03 DIAGNOSIS — M19011 Primary osteoarthritis, right shoulder: Secondary | ICD-10-CM | POA: Diagnosis not present

## 2019-08-03 DIAGNOSIS — S43431D Superior glenoid labrum lesion of right shoulder, subsequent encounter: Secondary | ICD-10-CM | POA: Diagnosis not present

## 2019-08-03 DIAGNOSIS — S46011D Strain of muscle(s) and tendon(s) of the rotator cuff of right shoulder, subsequent encounter: Secondary | ICD-10-CM | POA: Diagnosis not present

## 2019-08-03 DIAGNOSIS — M6281 Muscle weakness (generalized): Secondary | ICD-10-CM | POA: Diagnosis not present

## 2019-08-04 DIAGNOSIS — H401121 Primary open-angle glaucoma, left eye, mild stage: Secondary | ICD-10-CM | POA: Diagnosis not present

## 2019-08-04 DIAGNOSIS — E119 Type 2 diabetes mellitus without complications: Secondary | ICD-10-CM | POA: Diagnosis not present

## 2019-08-04 DIAGNOSIS — H35373 Puckering of macula, bilateral: Secondary | ICD-10-CM | POA: Diagnosis not present

## 2019-08-04 DIAGNOSIS — H40011 Open angle with borderline findings, low risk, right eye: Secondary | ICD-10-CM | POA: Diagnosis not present

## 2019-08-07 DIAGNOSIS — S46011D Strain of muscle(s) and tendon(s) of the rotator cuff of right shoulder, subsequent encounter: Secondary | ICD-10-CM | POA: Diagnosis not present

## 2019-08-07 DIAGNOSIS — M19011 Primary osteoarthritis, right shoulder: Secondary | ICD-10-CM | POA: Diagnosis not present

## 2019-08-07 DIAGNOSIS — S43431D Superior glenoid labrum lesion of right shoulder, subsequent encounter: Secondary | ICD-10-CM | POA: Diagnosis not present

## 2019-08-07 DIAGNOSIS — M6281 Muscle weakness (generalized): Secondary | ICD-10-CM | POA: Diagnosis not present

## 2019-08-09 DIAGNOSIS — E1169 Type 2 diabetes mellitus with other specified complication: Secondary | ICD-10-CM | POA: Diagnosis not present

## 2019-08-09 DIAGNOSIS — E1165 Type 2 diabetes mellitus with hyperglycemia: Secondary | ICD-10-CM | POA: Diagnosis not present

## 2019-08-09 DIAGNOSIS — I10 Essential (primary) hypertension: Secondary | ICD-10-CM | POA: Diagnosis not present

## 2019-08-09 DIAGNOSIS — M1712 Unilateral primary osteoarthritis, left knee: Secondary | ICD-10-CM | POA: Diagnosis not present

## 2019-08-09 DIAGNOSIS — M170 Bilateral primary osteoarthritis of knee: Secondary | ICD-10-CM | POA: Diagnosis not present

## 2019-08-09 DIAGNOSIS — E7800 Pure hypercholesterolemia, unspecified: Secondary | ICD-10-CM | POA: Diagnosis not present

## 2019-08-14 DIAGNOSIS — M19011 Primary osteoarthritis, right shoulder: Secondary | ICD-10-CM | POA: Diagnosis not present

## 2019-08-15 DIAGNOSIS — S46011D Strain of muscle(s) and tendon(s) of the rotator cuff of right shoulder, subsequent encounter: Secondary | ICD-10-CM | POA: Diagnosis not present

## 2019-08-15 DIAGNOSIS — M6281 Muscle weakness (generalized): Secondary | ICD-10-CM | POA: Diagnosis not present

## 2019-08-15 DIAGNOSIS — M19011 Primary osteoarthritis, right shoulder: Secondary | ICD-10-CM | POA: Diagnosis not present

## 2019-08-25 DIAGNOSIS — S43011D Anterior subluxation of right humerus, subsequent encounter: Secondary | ICD-10-CM | POA: Diagnosis not present

## 2019-08-25 DIAGNOSIS — M19011 Primary osteoarthritis, right shoulder: Secondary | ICD-10-CM | POA: Diagnosis not present

## 2019-08-25 DIAGNOSIS — S43431D Superior glenoid labrum lesion of right shoulder, subsequent encounter: Secondary | ICD-10-CM | POA: Diagnosis not present

## 2019-08-25 DIAGNOSIS — M6281 Muscle weakness (generalized): Secondary | ICD-10-CM | POA: Diagnosis not present

## 2019-08-29 DIAGNOSIS — S43431D Superior glenoid labrum lesion of right shoulder, subsequent encounter: Secondary | ICD-10-CM | POA: Diagnosis not present

## 2019-08-29 DIAGNOSIS — M6281 Muscle weakness (generalized): Secondary | ICD-10-CM | POA: Diagnosis not present

## 2019-08-29 DIAGNOSIS — S46011D Strain of muscle(s) and tendon(s) of the rotator cuff of right shoulder, subsequent encounter: Secondary | ICD-10-CM | POA: Diagnosis not present

## 2019-08-29 DIAGNOSIS — M19011 Primary osteoarthritis, right shoulder: Secondary | ICD-10-CM | POA: Diagnosis not present

## 2019-08-31 DIAGNOSIS — M6281 Muscle weakness (generalized): Secondary | ICD-10-CM | POA: Diagnosis not present

## 2019-08-31 DIAGNOSIS — S46011D Strain of muscle(s) and tendon(s) of the rotator cuff of right shoulder, subsequent encounter: Secondary | ICD-10-CM | POA: Diagnosis not present

## 2019-08-31 DIAGNOSIS — M19011 Primary osteoarthritis, right shoulder: Secondary | ICD-10-CM | POA: Diagnosis not present

## 2019-08-31 DIAGNOSIS — S43431D Superior glenoid labrum lesion of right shoulder, subsequent encounter: Secondary | ICD-10-CM | POA: Diagnosis not present

## 2019-09-05 DIAGNOSIS — M6281 Muscle weakness (generalized): Secondary | ICD-10-CM | POA: Diagnosis not present

## 2019-09-05 DIAGNOSIS — M19011 Primary osteoarthritis, right shoulder: Secondary | ICD-10-CM | POA: Diagnosis not present

## 2019-09-05 DIAGNOSIS — S43431D Superior glenoid labrum lesion of right shoulder, subsequent encounter: Secondary | ICD-10-CM | POA: Diagnosis not present

## 2019-09-05 DIAGNOSIS — S46011D Strain of muscle(s) and tendon(s) of the rotator cuff of right shoulder, subsequent encounter: Secondary | ICD-10-CM | POA: Diagnosis not present

## 2019-09-07 DIAGNOSIS — M6281 Muscle weakness (generalized): Secondary | ICD-10-CM | POA: Diagnosis not present

## 2019-09-07 DIAGNOSIS — S46011D Strain of muscle(s) and tendon(s) of the rotator cuff of right shoulder, subsequent encounter: Secondary | ICD-10-CM | POA: Diagnosis not present

## 2019-09-07 DIAGNOSIS — M19011 Primary osteoarthritis, right shoulder: Secondary | ICD-10-CM | POA: Diagnosis not present

## 2019-09-07 DIAGNOSIS — S43431D Superior glenoid labrum lesion of right shoulder, subsequent encounter: Secondary | ICD-10-CM | POA: Diagnosis not present

## 2019-09-11 DIAGNOSIS — M19011 Primary osteoarthritis, right shoulder: Secondary | ICD-10-CM | POA: Diagnosis not present

## 2019-09-12 DIAGNOSIS — S43431D Superior glenoid labrum lesion of right shoulder, subsequent encounter: Secondary | ICD-10-CM | POA: Diagnosis not present

## 2019-09-12 DIAGNOSIS — S46011D Strain of muscle(s) and tendon(s) of the rotator cuff of right shoulder, subsequent encounter: Secondary | ICD-10-CM | POA: Diagnosis not present

## 2019-09-12 DIAGNOSIS — M19011 Primary osteoarthritis, right shoulder: Secondary | ICD-10-CM | POA: Diagnosis not present

## 2019-09-12 DIAGNOSIS — M6281 Muscle weakness (generalized): Secondary | ICD-10-CM | POA: Diagnosis not present

## 2019-09-14 DIAGNOSIS — M19011 Primary osteoarthritis, right shoulder: Secondary | ICD-10-CM | POA: Diagnosis not present

## 2019-09-14 DIAGNOSIS — S46011D Strain of muscle(s) and tendon(s) of the rotator cuff of right shoulder, subsequent encounter: Secondary | ICD-10-CM | POA: Diagnosis not present

## 2019-09-14 DIAGNOSIS — M6281 Muscle weakness (generalized): Secondary | ICD-10-CM | POA: Diagnosis not present

## 2019-09-14 DIAGNOSIS — S43431D Superior glenoid labrum lesion of right shoulder, subsequent encounter: Secondary | ICD-10-CM | POA: Diagnosis not present

## 2019-09-19 DIAGNOSIS — S46011D Strain of muscle(s) and tendon(s) of the rotator cuff of right shoulder, subsequent encounter: Secondary | ICD-10-CM | POA: Diagnosis not present

## 2019-09-19 DIAGNOSIS — M6281 Muscle weakness (generalized): Secondary | ICD-10-CM | POA: Diagnosis not present

## 2019-09-19 DIAGNOSIS — M19011 Primary osteoarthritis, right shoulder: Secondary | ICD-10-CM | POA: Diagnosis not present

## 2019-09-19 DIAGNOSIS — S43431D Superior glenoid labrum lesion of right shoulder, subsequent encounter: Secondary | ICD-10-CM | POA: Diagnosis not present

## 2019-09-21 DIAGNOSIS — M6281 Muscle weakness (generalized): Secondary | ICD-10-CM | POA: Diagnosis not present

## 2019-09-21 DIAGNOSIS — S43431D Superior glenoid labrum lesion of right shoulder, subsequent encounter: Secondary | ICD-10-CM | POA: Diagnosis not present

## 2019-09-21 DIAGNOSIS — S46011D Strain of muscle(s) and tendon(s) of the rotator cuff of right shoulder, subsequent encounter: Secondary | ICD-10-CM | POA: Diagnosis not present

## 2019-09-21 DIAGNOSIS — M19011 Primary osteoarthritis, right shoulder: Secondary | ICD-10-CM | POA: Diagnosis not present

## 2019-09-26 DIAGNOSIS — S43431D Superior glenoid labrum lesion of right shoulder, subsequent encounter: Secondary | ICD-10-CM | POA: Diagnosis not present

## 2019-09-26 DIAGNOSIS — S46011D Strain of muscle(s) and tendon(s) of the rotator cuff of right shoulder, subsequent encounter: Secondary | ICD-10-CM | POA: Diagnosis not present

## 2019-09-26 DIAGNOSIS — M6281 Muscle weakness (generalized): Secondary | ICD-10-CM | POA: Diagnosis not present

## 2019-09-26 DIAGNOSIS — M19011 Primary osteoarthritis, right shoulder: Secondary | ICD-10-CM | POA: Diagnosis not present

## 2019-09-28 DIAGNOSIS — S43431D Superior glenoid labrum lesion of right shoulder, subsequent encounter: Secondary | ICD-10-CM | POA: Diagnosis not present

## 2019-09-28 DIAGNOSIS — M19011 Primary osteoarthritis, right shoulder: Secondary | ICD-10-CM | POA: Diagnosis not present

## 2019-09-28 DIAGNOSIS — S46011D Strain of muscle(s) and tendon(s) of the rotator cuff of right shoulder, subsequent encounter: Secondary | ICD-10-CM | POA: Diagnosis not present

## 2019-09-28 DIAGNOSIS — M6281 Muscle weakness (generalized): Secondary | ICD-10-CM | POA: Diagnosis not present

## 2019-10-03 DIAGNOSIS — S46011D Strain of muscle(s) and tendon(s) of the rotator cuff of right shoulder, subsequent encounter: Secondary | ICD-10-CM | POA: Diagnosis not present

## 2019-10-03 DIAGNOSIS — M6281 Muscle weakness (generalized): Secondary | ICD-10-CM | POA: Diagnosis not present

## 2019-10-03 DIAGNOSIS — M19011 Primary osteoarthritis, right shoulder: Secondary | ICD-10-CM | POA: Diagnosis not present

## 2019-10-03 DIAGNOSIS — S43431D Superior glenoid labrum lesion of right shoulder, subsequent encounter: Secondary | ICD-10-CM | POA: Diagnosis not present

## 2019-10-05 DIAGNOSIS — S43011D Anterior subluxation of right humerus, subsequent encounter: Secondary | ICD-10-CM | POA: Diagnosis not present

## 2019-10-05 DIAGNOSIS — M6281 Muscle weakness (generalized): Secondary | ICD-10-CM | POA: Diagnosis not present

## 2019-10-05 DIAGNOSIS — S43431D Superior glenoid labrum lesion of right shoulder, subsequent encounter: Secondary | ICD-10-CM | POA: Diagnosis not present

## 2019-10-05 DIAGNOSIS — M19011 Primary osteoarthritis, right shoulder: Secondary | ICD-10-CM | POA: Diagnosis not present

## 2019-10-10 DIAGNOSIS — S46011D Strain of muscle(s) and tendon(s) of the rotator cuff of right shoulder, subsequent encounter: Secondary | ICD-10-CM | POA: Diagnosis not present

## 2019-10-10 DIAGNOSIS — M19011 Primary osteoarthritis, right shoulder: Secondary | ICD-10-CM | POA: Diagnosis not present

## 2019-10-10 DIAGNOSIS — M6281 Muscle weakness (generalized): Secondary | ICD-10-CM | POA: Diagnosis not present

## 2019-10-10 DIAGNOSIS — S43431D Superior glenoid labrum lesion of right shoulder, subsequent encounter: Secondary | ICD-10-CM | POA: Diagnosis not present

## 2019-10-12 DIAGNOSIS — S43431D Superior glenoid labrum lesion of right shoulder, subsequent encounter: Secondary | ICD-10-CM | POA: Diagnosis not present

## 2019-10-12 DIAGNOSIS — M19011 Primary osteoarthritis, right shoulder: Secondary | ICD-10-CM | POA: Diagnosis not present

## 2019-10-12 DIAGNOSIS — M6281 Muscle weakness (generalized): Secondary | ICD-10-CM | POA: Diagnosis not present

## 2019-10-12 DIAGNOSIS — S46011D Strain of muscle(s) and tendon(s) of the rotator cuff of right shoulder, subsequent encounter: Secondary | ICD-10-CM | POA: Diagnosis not present

## 2019-10-13 ENCOUNTER — Other Ambulatory Visit: Payer: Self-pay | Admitting: Gastroenterology

## 2019-10-13 DIAGNOSIS — Z7952 Long term (current) use of systemic steroids: Secondary | ICD-10-CM

## 2019-10-19 DIAGNOSIS — M19011 Primary osteoarthritis, right shoulder: Secondary | ICD-10-CM | POA: Diagnosis not present

## 2019-10-19 DIAGNOSIS — S43431D Superior glenoid labrum lesion of right shoulder, subsequent encounter: Secondary | ICD-10-CM | POA: Diagnosis not present

## 2019-10-19 DIAGNOSIS — S46011D Strain of muscle(s) and tendon(s) of the rotator cuff of right shoulder, subsequent encounter: Secondary | ICD-10-CM | POA: Diagnosis not present

## 2019-10-19 DIAGNOSIS — M6281 Muscle weakness (generalized): Secondary | ICD-10-CM | POA: Diagnosis not present

## 2019-10-25 DIAGNOSIS — M19011 Primary osteoarthritis, right shoulder: Secondary | ICD-10-CM | POA: Diagnosis not present

## 2019-10-25 DIAGNOSIS — M6281 Muscle weakness (generalized): Secondary | ICD-10-CM | POA: Diagnosis not present

## 2019-10-25 DIAGNOSIS — S46011D Strain of muscle(s) and tendon(s) of the rotator cuff of right shoulder, subsequent encounter: Secondary | ICD-10-CM | POA: Diagnosis not present

## 2019-10-25 DIAGNOSIS — S43431D Superior glenoid labrum lesion of right shoulder, subsequent encounter: Secondary | ICD-10-CM | POA: Diagnosis not present

## 2019-10-27 DIAGNOSIS — M6281 Muscle weakness (generalized): Secondary | ICD-10-CM | POA: Diagnosis not present

## 2019-10-27 DIAGNOSIS — M19011 Primary osteoarthritis, right shoulder: Secondary | ICD-10-CM | POA: Diagnosis not present

## 2019-10-27 DIAGNOSIS — S46011D Strain of muscle(s) and tendon(s) of the rotator cuff of right shoulder, subsequent encounter: Secondary | ICD-10-CM | POA: Diagnosis not present

## 2019-10-27 DIAGNOSIS — S43431D Superior glenoid labrum lesion of right shoulder, subsequent encounter: Secondary | ICD-10-CM | POA: Diagnosis not present

## 2019-10-31 DIAGNOSIS — S43431D Superior glenoid labrum lesion of right shoulder, subsequent encounter: Secondary | ICD-10-CM | POA: Diagnosis not present

## 2019-10-31 DIAGNOSIS — M19011 Primary osteoarthritis, right shoulder: Secondary | ICD-10-CM | POA: Diagnosis not present

## 2019-10-31 DIAGNOSIS — M6281 Muscle weakness (generalized): Secondary | ICD-10-CM | POA: Diagnosis not present

## 2019-10-31 DIAGNOSIS — S46011D Strain of muscle(s) and tendon(s) of the rotator cuff of right shoulder, subsequent encounter: Secondary | ICD-10-CM | POA: Diagnosis not present

## 2019-11-02 DIAGNOSIS — M19011 Primary osteoarthritis, right shoulder: Secondary | ICD-10-CM | POA: Diagnosis not present

## 2019-11-02 DIAGNOSIS — S46011D Strain of muscle(s) and tendon(s) of the rotator cuff of right shoulder, subsequent encounter: Secondary | ICD-10-CM | POA: Diagnosis not present

## 2019-11-02 DIAGNOSIS — S43431D Superior glenoid labrum lesion of right shoulder, subsequent encounter: Secondary | ICD-10-CM | POA: Diagnosis not present

## 2019-11-02 DIAGNOSIS — M6281 Muscle weakness (generalized): Secondary | ICD-10-CM | POA: Diagnosis not present

## 2019-11-07 DIAGNOSIS — S43431D Superior glenoid labrum lesion of right shoulder, subsequent encounter: Secondary | ICD-10-CM | POA: Diagnosis not present

## 2019-11-07 DIAGNOSIS — M6281 Muscle weakness (generalized): Secondary | ICD-10-CM | POA: Diagnosis not present

## 2019-11-07 DIAGNOSIS — S46011D Strain of muscle(s) and tendon(s) of the rotator cuff of right shoulder, subsequent encounter: Secondary | ICD-10-CM | POA: Diagnosis not present

## 2019-11-07 DIAGNOSIS — M19011 Primary osteoarthritis, right shoulder: Secondary | ICD-10-CM | POA: Diagnosis not present

## 2019-11-10 DIAGNOSIS — S43431D Superior glenoid labrum lesion of right shoulder, subsequent encounter: Secondary | ICD-10-CM | POA: Diagnosis not present

## 2019-11-10 DIAGNOSIS — M6281 Muscle weakness (generalized): Secondary | ICD-10-CM | POA: Diagnosis not present

## 2019-11-10 DIAGNOSIS — M19011 Primary osteoarthritis, right shoulder: Secondary | ICD-10-CM | POA: Diagnosis not present

## 2019-11-10 DIAGNOSIS — S46011D Strain of muscle(s) and tendon(s) of the rotator cuff of right shoulder, subsequent encounter: Secondary | ICD-10-CM | POA: Diagnosis not present

## 2019-11-14 DIAGNOSIS — S43431D Superior glenoid labrum lesion of right shoulder, subsequent encounter: Secondary | ICD-10-CM | POA: Diagnosis not present

## 2019-11-14 DIAGNOSIS — M19011 Primary osteoarthritis, right shoulder: Secondary | ICD-10-CM | POA: Diagnosis not present

## 2019-11-14 DIAGNOSIS — M6281 Muscle weakness (generalized): Secondary | ICD-10-CM | POA: Diagnosis not present

## 2019-11-14 DIAGNOSIS — S46011D Strain of muscle(s) and tendon(s) of the rotator cuff of right shoulder, subsequent encounter: Secondary | ICD-10-CM | POA: Diagnosis not present

## 2019-11-16 DIAGNOSIS — M170 Bilateral primary osteoarthritis of knee: Secondary | ICD-10-CM | POA: Diagnosis not present

## 2019-11-16 DIAGNOSIS — M19011 Primary osteoarthritis, right shoulder: Secondary | ICD-10-CM | POA: Diagnosis not present

## 2019-11-16 DIAGNOSIS — S43431D Superior glenoid labrum lesion of right shoulder, subsequent encounter: Secondary | ICD-10-CM | POA: Diagnosis not present

## 2019-11-16 DIAGNOSIS — E7800 Pure hypercholesterolemia, unspecified: Secondary | ICD-10-CM | POA: Diagnosis not present

## 2019-11-16 DIAGNOSIS — S46011D Strain of muscle(s) and tendon(s) of the rotator cuff of right shoulder, subsequent encounter: Secondary | ICD-10-CM | POA: Diagnosis not present

## 2019-11-16 DIAGNOSIS — I10 Essential (primary) hypertension: Secondary | ICD-10-CM | POA: Diagnosis not present

## 2019-11-16 DIAGNOSIS — M1712 Unilateral primary osteoarthritis, left knee: Secondary | ICD-10-CM | POA: Diagnosis not present

## 2019-11-16 DIAGNOSIS — M6281 Muscle weakness (generalized): Secondary | ICD-10-CM | POA: Diagnosis not present

## 2019-11-16 DIAGNOSIS — E1169 Type 2 diabetes mellitus with other specified complication: Secondary | ICD-10-CM | POA: Diagnosis not present

## 2019-11-16 DIAGNOSIS — E1165 Type 2 diabetes mellitus with hyperglycemia: Secondary | ICD-10-CM | POA: Diagnosis not present

## 2019-11-17 ENCOUNTER — Other Ambulatory Visit: Payer: Self-pay

## 2019-11-17 ENCOUNTER — Ambulatory Visit
Admission: RE | Admit: 2019-11-17 | Discharge: 2019-11-17 | Disposition: A | Discharge status: Home / Self Care | Payer: Medicare HMO | Source: Ambulatory Visit | Attending: Gastroenterology | Admitting: Gastroenterology

## 2019-11-17 DIAGNOSIS — Z7952 Long term (current) use of systemic steroids: Secondary | ICD-10-CM | POA: Diagnosis not present

## 2019-11-21 DIAGNOSIS — S43431D Superior glenoid labrum lesion of right shoulder, subsequent encounter: Secondary | ICD-10-CM | POA: Diagnosis not present

## 2019-11-21 DIAGNOSIS — M6281 Muscle weakness (generalized): Secondary | ICD-10-CM | POA: Diagnosis not present

## 2019-11-21 DIAGNOSIS — S46011D Strain of muscle(s) and tendon(s) of the rotator cuff of right shoulder, subsequent encounter: Secondary | ICD-10-CM | POA: Diagnosis not present

## 2019-11-21 DIAGNOSIS — M19011 Primary osteoarthritis, right shoulder: Secondary | ICD-10-CM | POA: Diagnosis not present

## 2019-11-23 DIAGNOSIS — M6281 Muscle weakness (generalized): Secondary | ICD-10-CM | POA: Diagnosis not present

## 2019-11-23 DIAGNOSIS — S46011D Strain of muscle(s) and tendon(s) of the rotator cuff of right shoulder, subsequent encounter: Secondary | ICD-10-CM | POA: Diagnosis not present

## 2019-11-23 DIAGNOSIS — S43431D Superior glenoid labrum lesion of right shoulder, subsequent encounter: Secondary | ICD-10-CM | POA: Diagnosis not present

## 2019-11-23 DIAGNOSIS — M19011 Primary osteoarthritis, right shoulder: Secondary | ICD-10-CM | POA: Diagnosis not present

## 2019-11-28 DIAGNOSIS — M19011 Primary osteoarthritis, right shoulder: Secondary | ICD-10-CM | POA: Diagnosis not present

## 2019-11-28 DIAGNOSIS — S43431D Superior glenoid labrum lesion of right shoulder, subsequent encounter: Secondary | ICD-10-CM | POA: Diagnosis not present

## 2019-11-28 DIAGNOSIS — M6281 Muscle weakness (generalized): Secondary | ICD-10-CM | POA: Diagnosis not present

## 2019-11-28 DIAGNOSIS — S46011D Strain of muscle(s) and tendon(s) of the rotator cuff of right shoulder, subsequent encounter: Secondary | ICD-10-CM | POA: Diagnosis not present

## 2019-11-30 DIAGNOSIS — M6281 Muscle weakness (generalized): Secondary | ICD-10-CM | POA: Diagnosis not present

## 2019-11-30 DIAGNOSIS — S46011D Strain of muscle(s) and tendon(s) of the rotator cuff of right shoulder, subsequent encounter: Secondary | ICD-10-CM | POA: Diagnosis not present

## 2019-11-30 DIAGNOSIS — M19011 Primary osteoarthritis, right shoulder: Secondary | ICD-10-CM | POA: Diagnosis not present

## 2019-11-30 DIAGNOSIS — S43431D Superior glenoid labrum lesion of right shoulder, subsequent encounter: Secondary | ICD-10-CM | POA: Diagnosis not present

## 2019-12-06 DIAGNOSIS — S46011D Strain of muscle(s) and tendon(s) of the rotator cuff of right shoulder, subsequent encounter: Secondary | ICD-10-CM | POA: Diagnosis not present

## 2019-12-06 DIAGNOSIS — M19011 Primary osteoarthritis, right shoulder: Secondary | ICD-10-CM | POA: Diagnosis not present

## 2019-12-06 DIAGNOSIS — S43431D Superior glenoid labrum lesion of right shoulder, subsequent encounter: Secondary | ICD-10-CM | POA: Diagnosis not present

## 2019-12-06 DIAGNOSIS — M6281 Muscle weakness (generalized): Secondary | ICD-10-CM | POA: Diagnosis not present

## 2019-12-07 DIAGNOSIS — S43431D Superior glenoid labrum lesion of right shoulder, subsequent encounter: Secondary | ICD-10-CM | POA: Diagnosis not present

## 2019-12-07 DIAGNOSIS — S46011D Strain of muscle(s) and tendon(s) of the rotator cuff of right shoulder, subsequent encounter: Secondary | ICD-10-CM | POA: Diagnosis not present

## 2019-12-07 DIAGNOSIS — M6281 Muscle weakness (generalized): Secondary | ICD-10-CM | POA: Diagnosis not present

## 2019-12-07 DIAGNOSIS — M19011 Primary osteoarthritis, right shoulder: Secondary | ICD-10-CM | POA: Diagnosis not present

## 2019-12-11 DIAGNOSIS — M19011 Primary osteoarthritis, right shoulder: Secondary | ICD-10-CM | POA: Diagnosis not present

## 2020-01-01 DIAGNOSIS — K219 Gastro-esophageal reflux disease without esophagitis: Secondary | ICD-10-CM | POA: Diagnosis not present

## 2020-01-01 DIAGNOSIS — I10 Essential (primary) hypertension: Secondary | ICD-10-CM | POA: Diagnosis not present

## 2020-01-01 DIAGNOSIS — Z0000 Encounter for general adult medical examination without abnormal findings: Secondary | ICD-10-CM | POA: Diagnosis not present

## 2020-01-01 DIAGNOSIS — E7800 Pure hypercholesterolemia, unspecified: Secondary | ICD-10-CM | POA: Diagnosis not present

## 2020-01-01 DIAGNOSIS — Z1159 Encounter for screening for other viral diseases: Secondary | ICD-10-CM | POA: Diagnosis not present

## 2020-01-01 DIAGNOSIS — Z7984 Long term (current) use of oral hypoglycemic drugs: Secondary | ICD-10-CM | POA: Diagnosis not present

## 2020-01-01 DIAGNOSIS — E1169 Type 2 diabetes mellitus with other specified complication: Secondary | ICD-10-CM | POA: Diagnosis not present

## 2020-01-01 DIAGNOSIS — Z125 Encounter for screening for malignant neoplasm of prostate: Secondary | ICD-10-CM | POA: Diagnosis not present

## 2020-02-07 DIAGNOSIS — E7800 Pure hypercholesterolemia, unspecified: Secondary | ICD-10-CM | POA: Diagnosis not present

## 2020-02-07 DIAGNOSIS — M170 Bilateral primary osteoarthritis of knee: Secondary | ICD-10-CM | POA: Diagnosis not present

## 2020-02-07 DIAGNOSIS — M1712 Unilateral primary osteoarthritis, left knee: Secondary | ICD-10-CM | POA: Diagnosis not present

## 2020-02-07 DIAGNOSIS — I10 Essential (primary) hypertension: Secondary | ICD-10-CM | POA: Diagnosis not present

## 2020-02-07 DIAGNOSIS — E1169 Type 2 diabetes mellitus with other specified complication: Secondary | ICD-10-CM | POA: Diagnosis not present

## 2020-02-07 DIAGNOSIS — E1165 Type 2 diabetes mellitus with hyperglycemia: Secondary | ICD-10-CM | POA: Diagnosis not present

## 2020-02-07 DIAGNOSIS — K219 Gastro-esophageal reflux disease without esophagitis: Secondary | ICD-10-CM | POA: Diagnosis not present

## 2020-02-16 DIAGNOSIS — H40011 Open angle with borderline findings, low risk, right eye: Secondary | ICD-10-CM | POA: Diagnosis not present

## 2020-02-16 DIAGNOSIS — H401121 Primary open-angle glaucoma, left eye, mild stage: Secondary | ICD-10-CM | POA: Diagnosis not present

## 2020-02-16 DIAGNOSIS — H35373 Puckering of macula, bilateral: Secondary | ICD-10-CM | POA: Diagnosis not present

## 2020-02-16 DIAGNOSIS — H2513 Age-related nuclear cataract, bilateral: Secondary | ICD-10-CM | POA: Diagnosis not present

## 2020-05-15 DIAGNOSIS — E1165 Type 2 diabetes mellitus with hyperglycemia: Secondary | ICD-10-CM | POA: Diagnosis not present

## 2020-05-15 DIAGNOSIS — M1712 Unilateral primary osteoarthritis, left knee: Secondary | ICD-10-CM | POA: Diagnosis not present

## 2020-05-15 DIAGNOSIS — E1169 Type 2 diabetes mellitus with other specified complication: Secondary | ICD-10-CM | POA: Diagnosis not present

## 2020-05-15 DIAGNOSIS — K219 Gastro-esophageal reflux disease without esophagitis: Secondary | ICD-10-CM | POA: Diagnosis not present

## 2020-05-15 DIAGNOSIS — M170 Bilateral primary osteoarthritis of knee: Secondary | ICD-10-CM | POA: Diagnosis not present

## 2020-05-15 DIAGNOSIS — E7800 Pure hypercholesterolemia, unspecified: Secondary | ICD-10-CM | POA: Diagnosis not present

## 2020-05-15 DIAGNOSIS — I10 Essential (primary) hypertension: Secondary | ICD-10-CM | POA: Diagnosis not present

## 2020-07-01 DIAGNOSIS — E7800 Pure hypercholesterolemia, unspecified: Secondary | ICD-10-CM | POA: Diagnosis not present

## 2020-07-01 DIAGNOSIS — I10 Essential (primary) hypertension: Secondary | ICD-10-CM | POA: Diagnosis not present

## 2020-07-01 DIAGNOSIS — K219 Gastro-esophageal reflux disease without esophagitis: Secondary | ICD-10-CM | POA: Diagnosis not present

## 2020-07-01 DIAGNOSIS — I700 Atherosclerosis of aorta: Secondary | ICD-10-CM | POA: Diagnosis not present

## 2020-07-01 DIAGNOSIS — Z7984 Long term (current) use of oral hypoglycemic drugs: Secondary | ICD-10-CM | POA: Diagnosis not present

## 2020-07-01 DIAGNOSIS — E1169 Type 2 diabetes mellitus with other specified complication: Secondary | ICD-10-CM | POA: Diagnosis not present

## 2020-08-21 DIAGNOSIS — E1169 Type 2 diabetes mellitus with other specified complication: Secondary | ICD-10-CM | POA: Diagnosis not present

## 2020-08-21 DIAGNOSIS — I10 Essential (primary) hypertension: Secondary | ICD-10-CM | POA: Diagnosis not present

## 2020-08-21 DIAGNOSIS — K219 Gastro-esophageal reflux disease without esophagitis: Secondary | ICD-10-CM | POA: Diagnosis not present

## 2020-08-21 DIAGNOSIS — E7800 Pure hypercholesterolemia, unspecified: Secondary | ICD-10-CM | POA: Diagnosis not present

## 2020-08-21 DIAGNOSIS — M170 Bilateral primary osteoarthritis of knee: Secondary | ICD-10-CM | POA: Diagnosis not present

## 2020-08-21 DIAGNOSIS — E1165 Type 2 diabetes mellitus with hyperglycemia: Secondary | ICD-10-CM | POA: Diagnosis not present

## 2020-08-26 DIAGNOSIS — S39012A Strain of muscle, fascia and tendon of lower back, initial encounter: Secondary | ICD-10-CM | POA: Diagnosis not present

## 2020-08-26 DIAGNOSIS — M545 Low back pain, unspecified: Secondary | ICD-10-CM | POA: Diagnosis not present

## 2020-09-02 DIAGNOSIS — Z23 Encounter for immunization: Secondary | ICD-10-CM | POA: Diagnosis not present

## 2020-10-09 DIAGNOSIS — H401121 Primary open-angle glaucoma, left eye, mild stage: Secondary | ICD-10-CM | POA: Diagnosis not present

## 2020-10-09 DIAGNOSIS — H40011 Open angle with borderline findings, low risk, right eye: Secondary | ICD-10-CM | POA: Diagnosis not present

## 2020-10-17 DIAGNOSIS — K2100 Gastro-esophageal reflux disease with esophagitis, without bleeding: Secondary | ICD-10-CM | POA: Diagnosis not present

## 2020-10-17 DIAGNOSIS — Z86010 Personal history of colonic polyps: Secondary | ICD-10-CM | POA: Diagnosis not present

## 2020-11-12 DIAGNOSIS — E7800 Pure hypercholesterolemia, unspecified: Secondary | ICD-10-CM | POA: Diagnosis not present

## 2020-11-12 DIAGNOSIS — K219 Gastro-esophageal reflux disease without esophagitis: Secondary | ICD-10-CM | POA: Diagnosis not present

## 2020-11-12 DIAGNOSIS — E1165 Type 2 diabetes mellitus with hyperglycemia: Secondary | ICD-10-CM | POA: Diagnosis not present

## 2020-11-12 DIAGNOSIS — I10 Essential (primary) hypertension: Secondary | ICD-10-CM | POA: Diagnosis not present

## 2020-11-12 DIAGNOSIS — M170 Bilateral primary osteoarthritis of knee: Secondary | ICD-10-CM | POA: Diagnosis not present

## 2020-11-12 DIAGNOSIS — E1169 Type 2 diabetes mellitus with other specified complication: Secondary | ICD-10-CM | POA: Diagnosis not present

## 2020-11-19 DIAGNOSIS — M545 Low back pain, unspecified: Secondary | ICD-10-CM | POA: Diagnosis not present

## 2020-11-19 DIAGNOSIS — M62830 Muscle spasm of back: Secondary | ICD-10-CM | POA: Diagnosis not present

## 2020-11-25 DIAGNOSIS — M25552 Pain in left hip: Secondary | ICD-10-CM | POA: Diagnosis not present

## 2020-11-25 DIAGNOSIS — M545 Low back pain, unspecified: Secondary | ICD-10-CM | POA: Diagnosis not present

## 2020-12-12 DIAGNOSIS — M545 Low back pain, unspecified: Secondary | ICD-10-CM | POA: Diagnosis not present

## 2020-12-18 DIAGNOSIS — M545 Low back pain, unspecified: Secondary | ICD-10-CM | POA: Diagnosis not present

## 2020-12-23 DIAGNOSIS — M5416 Radiculopathy, lumbar region: Secondary | ICD-10-CM | POA: Diagnosis not present

## 2020-12-26 DIAGNOSIS — M545 Low back pain, unspecified: Secondary | ICD-10-CM | POA: Diagnosis not present

## 2020-12-26 DIAGNOSIS — M6281 Muscle weakness (generalized): Secondary | ICD-10-CM | POA: Diagnosis not present

## 2020-12-26 DIAGNOSIS — M47896 Other spondylosis, lumbar region: Secondary | ICD-10-CM | POA: Diagnosis not present

## 2020-12-26 DIAGNOSIS — M48061 Spinal stenosis, lumbar region without neurogenic claudication: Secondary | ICD-10-CM | POA: Diagnosis not present

## 2020-12-30 DIAGNOSIS — M47896 Other spondylosis, lumbar region: Secondary | ICD-10-CM | POA: Diagnosis not present

## 2020-12-30 DIAGNOSIS — M545 Low back pain, unspecified: Secondary | ICD-10-CM | POA: Diagnosis not present

## 2020-12-30 DIAGNOSIS — M48061 Spinal stenosis, lumbar region without neurogenic claudication: Secondary | ICD-10-CM | POA: Diagnosis not present

## 2020-12-30 DIAGNOSIS — M6281 Muscle weakness (generalized): Secondary | ICD-10-CM | POA: Diagnosis not present

## 2021-01-03 DIAGNOSIS — M48061 Spinal stenosis, lumbar region without neurogenic claudication: Secondary | ICD-10-CM | POA: Diagnosis not present

## 2021-01-03 DIAGNOSIS — M6281 Muscle weakness (generalized): Secondary | ICD-10-CM | POA: Diagnosis not present

## 2021-01-03 DIAGNOSIS — M545 Low back pain, unspecified: Secondary | ICD-10-CM | POA: Diagnosis not present

## 2021-01-03 DIAGNOSIS — M47896 Other spondylosis, lumbar region: Secondary | ICD-10-CM | POA: Diagnosis not present

## 2021-01-06 DIAGNOSIS — M47896 Other spondylosis, lumbar region: Secondary | ICD-10-CM | POA: Diagnosis not present

## 2021-01-06 DIAGNOSIS — M6281 Muscle weakness (generalized): Secondary | ICD-10-CM | POA: Diagnosis not present

## 2021-01-06 DIAGNOSIS — M545 Low back pain, unspecified: Secondary | ICD-10-CM | POA: Diagnosis not present

## 2021-01-06 DIAGNOSIS — M48061 Spinal stenosis, lumbar region without neurogenic claudication: Secondary | ICD-10-CM | POA: Diagnosis not present

## 2021-01-08 DIAGNOSIS — M47896 Other spondylosis, lumbar region: Secondary | ICD-10-CM | POA: Diagnosis not present

## 2021-01-08 DIAGNOSIS — M48061 Spinal stenosis, lumbar region without neurogenic claudication: Secondary | ICD-10-CM | POA: Diagnosis not present

## 2021-01-08 DIAGNOSIS — M6281 Muscle weakness (generalized): Secondary | ICD-10-CM | POA: Diagnosis not present

## 2021-01-08 DIAGNOSIS — M545 Low back pain, unspecified: Secondary | ICD-10-CM | POA: Diagnosis not present

## 2021-01-13 DIAGNOSIS — M48061 Spinal stenosis, lumbar region without neurogenic claudication: Secondary | ICD-10-CM | POA: Diagnosis not present

## 2021-01-13 DIAGNOSIS — M47896 Other spondylosis, lumbar region: Secondary | ICD-10-CM | POA: Diagnosis not present

## 2021-01-13 DIAGNOSIS — M6281 Muscle weakness (generalized): Secondary | ICD-10-CM | POA: Diagnosis not present

## 2021-01-13 DIAGNOSIS — M545 Low back pain, unspecified: Secondary | ICD-10-CM | POA: Diagnosis not present

## 2021-01-16 DIAGNOSIS — Z6828 Body mass index (BMI) 28.0-28.9, adult: Secondary | ICD-10-CM | POA: Diagnosis not present

## 2021-01-16 DIAGNOSIS — M48061 Spinal stenosis, lumbar region without neurogenic claudication: Secondary | ICD-10-CM | POA: Diagnosis not present

## 2021-01-16 DIAGNOSIS — I10 Essential (primary) hypertension: Secondary | ICD-10-CM | POA: Diagnosis not present

## 2021-01-20 DIAGNOSIS — M48061 Spinal stenosis, lumbar region without neurogenic claudication: Secondary | ICD-10-CM | POA: Diagnosis not present

## 2021-01-20 DIAGNOSIS — M545 Low back pain, unspecified: Secondary | ICD-10-CM | POA: Diagnosis not present

## 2021-01-20 DIAGNOSIS — M6281 Muscle weakness (generalized): Secondary | ICD-10-CM | POA: Diagnosis not present

## 2021-01-20 DIAGNOSIS — M47896 Other spondylosis, lumbar region: Secondary | ICD-10-CM | POA: Diagnosis not present

## 2021-01-28 DIAGNOSIS — M48061 Spinal stenosis, lumbar region without neurogenic claudication: Secondary | ICD-10-CM | POA: Diagnosis not present

## 2021-01-28 DIAGNOSIS — M6281 Muscle weakness (generalized): Secondary | ICD-10-CM | POA: Diagnosis not present

## 2021-01-28 DIAGNOSIS — M545 Low back pain, unspecified: Secondary | ICD-10-CM | POA: Diagnosis not present

## 2021-01-28 DIAGNOSIS — M47896 Other spondylosis, lumbar region: Secondary | ICD-10-CM | POA: Diagnosis not present

## 2021-01-30 DIAGNOSIS — M5416 Radiculopathy, lumbar region: Secondary | ICD-10-CM | POA: Diagnosis not present

## 2021-01-30 DIAGNOSIS — M48061 Spinal stenosis, lumbar region without neurogenic claudication: Secondary | ICD-10-CM | POA: Diagnosis not present

## 2021-02-12 DIAGNOSIS — I10 Essential (primary) hypertension: Secondary | ICD-10-CM | POA: Diagnosis not present

## 2021-02-12 DIAGNOSIS — Z0000 Encounter for general adult medical examination without abnormal findings: Secondary | ICD-10-CM | POA: Diagnosis not present

## 2021-02-12 DIAGNOSIS — M170 Bilateral primary osteoarthritis of knee: Secondary | ICD-10-CM | POA: Diagnosis not present

## 2021-02-12 DIAGNOSIS — Z7984 Long term (current) use of oral hypoglycemic drugs: Secondary | ICD-10-CM | POA: Diagnosis not present

## 2021-02-12 DIAGNOSIS — Z1389 Encounter for screening for other disorder: Secondary | ICD-10-CM | POA: Diagnosis not present

## 2021-02-12 DIAGNOSIS — K219 Gastro-esophageal reflux disease without esophagitis: Secondary | ICD-10-CM | POA: Diagnosis not present

## 2021-02-12 DIAGNOSIS — Z125 Encounter for screening for malignant neoplasm of prostate: Secondary | ICD-10-CM | POA: Diagnosis not present

## 2021-02-12 DIAGNOSIS — E7800 Pure hypercholesterolemia, unspecified: Secondary | ICD-10-CM | POA: Diagnosis not present

## 2021-02-12 DIAGNOSIS — E1169 Type 2 diabetes mellitus with other specified complication: Secondary | ICD-10-CM | POA: Diagnosis not present

## 2021-02-25 DIAGNOSIS — R202 Paresthesia of skin: Secondary | ICD-10-CM | POA: Diagnosis not present

## 2021-02-25 DIAGNOSIS — R200 Anesthesia of skin: Secondary | ICD-10-CM | POA: Diagnosis not present

## 2021-02-25 DIAGNOSIS — M48061 Spinal stenosis, lumbar region without neurogenic claudication: Secondary | ICD-10-CM | POA: Diagnosis not present

## 2021-04-08 DIAGNOSIS — E119 Type 2 diabetes mellitus without complications: Secondary | ICD-10-CM | POA: Diagnosis not present

## 2021-04-08 DIAGNOSIS — H401121 Primary open-angle glaucoma, left eye, mild stage: Secondary | ICD-10-CM | POA: Diagnosis not present

## 2021-04-08 DIAGNOSIS — H25013 Cortical age-related cataract, bilateral: Secondary | ICD-10-CM | POA: Diagnosis not present

## 2021-04-08 DIAGNOSIS — H40011 Open angle with borderline findings, low risk, right eye: Secondary | ICD-10-CM | POA: Diagnosis not present

## 2021-06-10 DIAGNOSIS — I10 Essential (primary) hypertension: Secondary | ICD-10-CM | POA: Diagnosis not present

## 2021-06-10 DIAGNOSIS — K219 Gastro-esophageal reflux disease without esophagitis: Secondary | ICD-10-CM | POA: Diagnosis not present

## 2021-06-10 DIAGNOSIS — M170 Bilateral primary osteoarthritis of knee: Secondary | ICD-10-CM | POA: Diagnosis not present

## 2021-06-10 DIAGNOSIS — E1169 Type 2 diabetes mellitus with other specified complication: Secondary | ICD-10-CM | POA: Diagnosis not present

## 2021-06-10 DIAGNOSIS — E7800 Pure hypercholesterolemia, unspecified: Secondary | ICD-10-CM | POA: Diagnosis not present

## 2021-08-14 DIAGNOSIS — I700 Atherosclerosis of aorta: Secondary | ICD-10-CM | POA: Diagnosis not present

## 2021-08-14 DIAGNOSIS — M170 Bilateral primary osteoarthritis of knee: Secondary | ICD-10-CM | POA: Diagnosis not present

## 2021-08-14 DIAGNOSIS — Z7984 Long term (current) use of oral hypoglycemic drugs: Secondary | ICD-10-CM | POA: Diagnosis not present

## 2021-08-14 DIAGNOSIS — I10 Essential (primary) hypertension: Secondary | ICD-10-CM | POA: Diagnosis not present

## 2021-08-14 DIAGNOSIS — E1169 Type 2 diabetes mellitus with other specified complication: Secondary | ICD-10-CM | POA: Diagnosis not present

## 2021-08-14 DIAGNOSIS — E7800 Pure hypercholesterolemia, unspecified: Secondary | ICD-10-CM | POA: Diagnosis not present

## 2021-08-14 DIAGNOSIS — K219 Gastro-esophageal reflux disease without esophagitis: Secondary | ICD-10-CM | POA: Diagnosis not present

## 2021-10-06 DIAGNOSIS — H401121 Primary open-angle glaucoma, left eye, mild stage: Secondary | ICD-10-CM | POA: Diagnosis not present

## 2021-10-06 DIAGNOSIS — H40011 Open angle with borderline findings, low risk, right eye: Secondary | ICD-10-CM | POA: Diagnosis not present

## 2021-10-29 DIAGNOSIS — M47816 Spondylosis without myelopathy or radiculopathy, lumbar region: Secondary | ICD-10-CM | POA: Diagnosis not present

## 2021-11-13 DIAGNOSIS — M47816 Spondylosis without myelopathy or radiculopathy, lumbar region: Secondary | ICD-10-CM | POA: Diagnosis not present

## 2021-11-27 DIAGNOSIS — M47816 Spondylosis without myelopathy or radiculopathy, lumbar region: Secondary | ICD-10-CM | POA: Diagnosis not present

## 2021-12-11 DIAGNOSIS — M47816 Spondylosis without myelopathy or radiculopathy, lumbar region: Secondary | ICD-10-CM | POA: Diagnosis not present

## 2022-01-02 DIAGNOSIS — K2100 Gastro-esophageal reflux disease with esophagitis, without bleeding: Secondary | ICD-10-CM | POA: Diagnosis not present

## 2022-01-02 DIAGNOSIS — Z86010 Personal history of colonic polyps: Secondary | ICD-10-CM | POA: Diagnosis not present

## 2022-01-02 DIAGNOSIS — R140 Abdominal distension (gaseous): Secondary | ICD-10-CM | POA: Diagnosis not present

## 2022-01-14 DIAGNOSIS — Z6828 Body mass index (BMI) 28.0-28.9, adult: Secondary | ICD-10-CM | POA: Diagnosis not present

## 2022-01-14 DIAGNOSIS — M47816 Spondylosis without myelopathy or radiculopathy, lumbar region: Secondary | ICD-10-CM | POA: Diagnosis not present

## 2022-01-14 DIAGNOSIS — G8929 Other chronic pain: Secondary | ICD-10-CM | POA: Diagnosis not present

## 2022-01-14 DIAGNOSIS — M5416 Radiculopathy, lumbar region: Secondary | ICD-10-CM | POA: Diagnosis not present

## 2022-01-28 DIAGNOSIS — M6281 Muscle weakness (generalized): Secondary | ICD-10-CM | POA: Diagnosis not present

## 2022-01-28 DIAGNOSIS — M5416 Radiculopathy, lumbar region: Secondary | ICD-10-CM | POA: Diagnosis not present

## 2022-01-28 DIAGNOSIS — M47896 Other spondylosis, lumbar region: Secondary | ICD-10-CM | POA: Diagnosis not present

## 2022-02-02 DIAGNOSIS — M5416 Radiculopathy, lumbar region: Secondary | ICD-10-CM | POA: Diagnosis not present

## 2022-02-02 DIAGNOSIS — M47896 Other spondylosis, lumbar region: Secondary | ICD-10-CM | POA: Diagnosis not present

## 2022-02-02 DIAGNOSIS — M6281 Muscle weakness (generalized): Secondary | ICD-10-CM | POA: Diagnosis not present

## 2022-02-04 DIAGNOSIS — M47896 Other spondylosis, lumbar region: Secondary | ICD-10-CM | POA: Diagnosis not present

## 2022-02-04 DIAGNOSIS — M6281 Muscle weakness (generalized): Secondary | ICD-10-CM | POA: Diagnosis not present

## 2022-02-04 DIAGNOSIS — M5416 Radiculopathy, lumbar region: Secondary | ICD-10-CM | POA: Diagnosis not present

## 2022-02-11 DIAGNOSIS — M47896 Other spondylosis, lumbar region: Secondary | ICD-10-CM | POA: Diagnosis not present

## 2022-02-11 DIAGNOSIS — M6281 Muscle weakness (generalized): Secondary | ICD-10-CM | POA: Diagnosis not present

## 2022-02-11 DIAGNOSIS — M5416 Radiculopathy, lumbar region: Secondary | ICD-10-CM | POA: Diagnosis not present

## 2022-02-18 DIAGNOSIS — M6281 Muscle weakness (generalized): Secondary | ICD-10-CM | POA: Diagnosis not present

## 2022-02-18 DIAGNOSIS — M5416 Radiculopathy, lumbar region: Secondary | ICD-10-CM | POA: Diagnosis not present

## 2022-02-18 DIAGNOSIS — M47896 Other spondylosis, lumbar region: Secondary | ICD-10-CM | POA: Diagnosis not present

## 2022-02-25 DIAGNOSIS — M6281 Muscle weakness (generalized): Secondary | ICD-10-CM | POA: Diagnosis not present

## 2022-02-25 DIAGNOSIS — M47896 Other spondylosis, lumbar region: Secondary | ICD-10-CM | POA: Diagnosis not present

## 2022-02-25 DIAGNOSIS — M5416 Radiculopathy, lumbar region: Secondary | ICD-10-CM | POA: Diagnosis not present

## 2022-03-04 DIAGNOSIS — M6281 Muscle weakness (generalized): Secondary | ICD-10-CM | POA: Diagnosis not present

## 2022-03-04 DIAGNOSIS — M47896 Other spondylosis, lumbar region: Secondary | ICD-10-CM | POA: Diagnosis not present

## 2022-03-04 DIAGNOSIS — M5416 Radiculopathy, lumbar region: Secondary | ICD-10-CM | POA: Diagnosis not present

## 2022-03-12 DIAGNOSIS — K219 Gastro-esophageal reflux disease without esophagitis: Secondary | ICD-10-CM | POA: Diagnosis not present

## 2022-03-12 DIAGNOSIS — I10 Essential (primary) hypertension: Secondary | ICD-10-CM | POA: Diagnosis not present

## 2022-03-12 DIAGNOSIS — E1165 Type 2 diabetes mellitus with hyperglycemia: Secondary | ICD-10-CM | POA: Diagnosis not present

## 2022-03-12 DIAGNOSIS — E7800 Pure hypercholesterolemia, unspecified: Secondary | ICD-10-CM | POA: Diagnosis not present

## 2022-03-12 DIAGNOSIS — M170 Bilateral primary osteoarthritis of knee: Secondary | ICD-10-CM | POA: Diagnosis not present

## 2022-03-12 DIAGNOSIS — E1169 Type 2 diabetes mellitus with other specified complication: Secondary | ICD-10-CM | POA: Diagnosis not present

## 2022-03-12 DIAGNOSIS — Z0000 Encounter for general adult medical examination without abnormal findings: Secondary | ICD-10-CM | POA: Diagnosis not present

## 2022-03-12 DIAGNOSIS — Z1331 Encounter for screening for depression: Secondary | ICD-10-CM | POA: Diagnosis not present

## 2022-03-12 DIAGNOSIS — I700 Atherosclerosis of aorta: Secondary | ICD-10-CM | POA: Diagnosis not present

## 2022-03-18 DIAGNOSIS — M6281 Muscle weakness (generalized): Secondary | ICD-10-CM | POA: Diagnosis not present

## 2022-03-18 DIAGNOSIS — M5416 Radiculopathy, lumbar region: Secondary | ICD-10-CM | POA: Diagnosis not present

## 2022-03-18 DIAGNOSIS — M47896 Other spondylosis, lumbar region: Secondary | ICD-10-CM | POA: Diagnosis not present

## 2022-04-09 DIAGNOSIS — M6281 Muscle weakness (generalized): Secondary | ICD-10-CM | POA: Diagnosis not present

## 2022-04-09 DIAGNOSIS — M47896 Other spondylosis, lumbar region: Secondary | ICD-10-CM | POA: Diagnosis not present

## 2022-04-09 DIAGNOSIS — M5416 Radiculopathy, lumbar region: Secondary | ICD-10-CM | POA: Diagnosis not present

## 2022-04-10 DIAGNOSIS — H25813 Combined forms of age-related cataract, bilateral: Secondary | ICD-10-CM | POA: Diagnosis not present

## 2022-04-10 DIAGNOSIS — E119 Type 2 diabetes mellitus without complications: Secondary | ICD-10-CM | POA: Diagnosis not present

## 2022-04-10 DIAGNOSIS — H40011 Open angle with borderline findings, low risk, right eye: Secondary | ICD-10-CM | POA: Diagnosis not present

## 2022-04-10 DIAGNOSIS — H35033 Hypertensive retinopathy, bilateral: Secondary | ICD-10-CM | POA: Diagnosis not present

## 2022-04-10 DIAGNOSIS — H524 Presbyopia: Secondary | ICD-10-CM | POA: Diagnosis not present

## 2022-04-10 DIAGNOSIS — H401121 Primary open-angle glaucoma, left eye, mild stage: Secondary | ICD-10-CM | POA: Diagnosis not present

## 2022-04-27 DIAGNOSIS — M5416 Radiculopathy, lumbar region: Secondary | ICD-10-CM | POA: Diagnosis not present

## 2022-04-27 DIAGNOSIS — M47896 Other spondylosis, lumbar region: Secondary | ICD-10-CM | POA: Diagnosis not present

## 2022-04-27 DIAGNOSIS — M6281 Muscle weakness (generalized): Secondary | ICD-10-CM | POA: Diagnosis not present

## 2022-05-01 DIAGNOSIS — H401121 Primary open-angle glaucoma, left eye, mild stage: Secondary | ICD-10-CM | POA: Diagnosis not present

## 2022-05-21 ENCOUNTER — Ambulatory Visit
Admission: RE | Admit: 2022-05-21 | Discharge: 2022-05-21 | Disposition: A | Discharge status: Home / Self Care | Payer: Medicare HMO | Source: Ambulatory Visit | Attending: Gastroenterology | Admitting: Gastroenterology

## 2022-05-21 ENCOUNTER — Other Ambulatory Visit: Payer: Self-pay | Admitting: Gastroenterology

## 2022-05-21 DIAGNOSIS — Z86010 Personal history of colonic polyps: Secondary | ICD-10-CM | POA: Diagnosis not present

## 2022-05-21 DIAGNOSIS — K2100 Gastro-esophageal reflux disease with esophagitis, without bleeding: Secondary | ICD-10-CM | POA: Diagnosis not present

## 2022-05-21 DIAGNOSIS — R140 Abdominal distension (gaseous): Secondary | ICD-10-CM

## 2022-05-21 DIAGNOSIS — K5900 Constipation, unspecified: Secondary | ICD-10-CM | POA: Diagnosis not present

## 2022-05-22 DIAGNOSIS — H401121 Primary open-angle glaucoma, left eye, mild stage: Secondary | ICD-10-CM | POA: Diagnosis not present

## 2022-09-10 DIAGNOSIS — E669 Obesity, unspecified: Secondary | ICD-10-CM | POA: Diagnosis not present

## 2022-09-10 DIAGNOSIS — K219 Gastro-esophageal reflux disease without esophagitis: Secondary | ICD-10-CM | POA: Diagnosis not present

## 2022-09-10 DIAGNOSIS — E7800 Pure hypercholesterolemia, unspecified: Secondary | ICD-10-CM | POA: Diagnosis not present

## 2022-09-10 DIAGNOSIS — E1136 Type 2 diabetes mellitus with diabetic cataract: Secondary | ICD-10-CM | POA: Diagnosis not present

## 2022-09-10 DIAGNOSIS — H35033 Hypertensive retinopathy, bilateral: Secondary | ICD-10-CM | POA: Diagnosis not present

## 2022-09-10 DIAGNOSIS — E119 Type 2 diabetes mellitus without complications: Secondary | ICD-10-CM | POA: Diagnosis not present

## 2022-09-10 DIAGNOSIS — I10 Essential (primary) hypertension: Secondary | ICD-10-CM | POA: Diagnosis not present

## 2022-09-10 DIAGNOSIS — M170 Bilateral primary osteoarthritis of knee: Secondary | ICD-10-CM | POA: Diagnosis not present

## 2022-10-09 DIAGNOSIS — H40011 Open angle with borderline findings, low risk, right eye: Secondary | ICD-10-CM | POA: Diagnosis not present

## 2022-10-09 DIAGNOSIS — H401121 Primary open-angle glaucoma, left eye, mild stage: Secondary | ICD-10-CM | POA: Diagnosis not present

## 2022-11-27 DIAGNOSIS — Z860100 Personal history of colon polyps, unspecified: Secondary | ICD-10-CM | POA: Diagnosis not present

## 2022-11-27 DIAGNOSIS — Z09 Encounter for follow-up examination after completed treatment for conditions other than malignant neoplasm: Secondary | ICD-10-CM | POA: Diagnosis not present

## 2022-11-27 DIAGNOSIS — K648 Other hemorrhoids: Secondary | ICD-10-CM | POA: Diagnosis not present

## 2023-01-27 DIAGNOSIS — J018 Other acute sinusitis: Secondary | ICD-10-CM | POA: Diagnosis not present

## 2023-03-29 DIAGNOSIS — Z23 Encounter for immunization: Secondary | ICD-10-CM | POA: Diagnosis not present

## 2023-03-29 DIAGNOSIS — Z1331 Encounter for screening for depression: Secondary | ICD-10-CM | POA: Diagnosis not present

## 2023-03-29 DIAGNOSIS — I10 Essential (primary) hypertension: Secondary | ICD-10-CM | POA: Diagnosis not present

## 2023-03-29 DIAGNOSIS — E7800 Pure hypercholesterolemia, unspecified: Secondary | ICD-10-CM | POA: Diagnosis not present

## 2023-03-29 DIAGNOSIS — I700 Atherosclerosis of aorta: Secondary | ICD-10-CM | POA: Diagnosis not present

## 2023-03-29 DIAGNOSIS — K219 Gastro-esophageal reflux disease without esophagitis: Secondary | ICD-10-CM | POA: Diagnosis not present

## 2023-03-29 DIAGNOSIS — H35033 Hypertensive retinopathy, bilateral: Secondary | ICD-10-CM | POA: Diagnosis not present

## 2023-03-29 DIAGNOSIS — Z0000 Encounter for general adult medical examination without abnormal findings: Secondary | ICD-10-CM | POA: Diagnosis not present

## 2023-03-29 DIAGNOSIS — E1169 Type 2 diabetes mellitus with other specified complication: Secondary | ICD-10-CM | POA: Diagnosis not present

## 2023-04-05 DIAGNOSIS — H6123 Impacted cerumen, bilateral: Secondary | ICD-10-CM | POA: Diagnosis not present

## 2023-04-16 DIAGNOSIS — E119 Type 2 diabetes mellitus without complications: Secondary | ICD-10-CM | POA: Diagnosis not present

## 2023-04-16 DIAGNOSIS — H25813 Combined forms of age-related cataract, bilateral: Secondary | ICD-10-CM | POA: Diagnosis not present

## 2023-04-16 DIAGNOSIS — H401121 Primary open-angle glaucoma, left eye, mild stage: Secondary | ICD-10-CM | POA: Diagnosis not present

## 2023-04-16 DIAGNOSIS — H40011 Open angle with borderline findings, low risk, right eye: Secondary | ICD-10-CM | POA: Diagnosis not present

## 2023-07-06 DIAGNOSIS — T148XXA Other injury of unspecified body region, initial encounter: Secondary | ICD-10-CM | POA: Diagnosis not present

## 2023-07-17 DIAGNOSIS — E669 Obesity, unspecified: Secondary | ICD-10-CM | POA: Diagnosis not present

## 2023-07-17 DIAGNOSIS — M170 Bilateral primary osteoarthritis of knee: Secondary | ICD-10-CM | POA: Diagnosis not present

## 2023-07-17 DIAGNOSIS — E7800 Pure hypercholesterolemia, unspecified: Secondary | ICD-10-CM | POA: Diagnosis not present

## 2023-07-17 DIAGNOSIS — H35033 Hypertensive retinopathy, bilateral: Secondary | ICD-10-CM | POA: Diagnosis not present

## 2023-07-20 DIAGNOSIS — I10 Essential (primary) hypertension: Secondary | ICD-10-CM | POA: Diagnosis not present

## 2023-07-20 DIAGNOSIS — H35033 Hypertensive retinopathy, bilateral: Secondary | ICD-10-CM | POA: Diagnosis not present

## 2023-07-20 DIAGNOSIS — E1169 Type 2 diabetes mellitus with other specified complication: Secondary | ICD-10-CM | POA: Diagnosis not present

## 2023-07-20 DIAGNOSIS — E119 Type 2 diabetes mellitus without complications: Secondary | ICD-10-CM | POA: Diagnosis not present

## 2023-08-18 DIAGNOSIS — E1169 Type 2 diabetes mellitus with other specified complication: Secondary | ICD-10-CM | POA: Diagnosis not present

## 2023-08-18 DIAGNOSIS — H35033 Hypertensive retinopathy, bilateral: Secondary | ICD-10-CM | POA: Diagnosis not present

## 2023-08-18 DIAGNOSIS — I10 Essential (primary) hypertension: Secondary | ICD-10-CM | POA: Diagnosis not present

## 2023-08-18 DIAGNOSIS — E119 Type 2 diabetes mellitus without complications: Secondary | ICD-10-CM | POA: Diagnosis not present

## 2023-09-16 DIAGNOSIS — I10 Essential (primary) hypertension: Secondary | ICD-10-CM | POA: Diagnosis not present

## 2023-09-16 DIAGNOSIS — E119 Type 2 diabetes mellitus without complications: Secondary | ICD-10-CM | POA: Diagnosis not present

## 2023-09-16 DIAGNOSIS — H35033 Hypertensive retinopathy, bilateral: Secondary | ICD-10-CM | POA: Diagnosis not present

## 2023-09-16 DIAGNOSIS — M170 Bilateral primary osteoarthritis of knee: Secondary | ICD-10-CM | POA: Diagnosis not present

## 2023-09-16 DIAGNOSIS — E1169 Type 2 diabetes mellitus with other specified complication: Secondary | ICD-10-CM | POA: Diagnosis not present

## 2023-09-16 DIAGNOSIS — E7800 Pure hypercholesterolemia, unspecified: Secondary | ICD-10-CM | POA: Diagnosis not present

## 2023-09-16 DIAGNOSIS — E669 Obesity, unspecified: Secondary | ICD-10-CM | POA: Diagnosis not present

## 2023-09-17 DIAGNOSIS — E1169 Type 2 diabetes mellitus with other specified complication: Secondary | ICD-10-CM | POA: Diagnosis not present

## 2023-09-17 DIAGNOSIS — E119 Type 2 diabetes mellitus without complications: Secondary | ICD-10-CM | POA: Diagnosis not present

## 2023-09-17 DIAGNOSIS — H35033 Hypertensive retinopathy, bilateral: Secondary | ICD-10-CM | POA: Diagnosis not present

## 2023-09-17 DIAGNOSIS — I10 Essential (primary) hypertension: Secondary | ICD-10-CM | POA: Diagnosis not present

## 2023-09-27 DIAGNOSIS — H35033 Hypertensive retinopathy, bilateral: Secondary | ICD-10-CM | POA: Diagnosis not present

## 2023-09-27 DIAGNOSIS — E1169 Type 2 diabetes mellitus with other specified complication: Secondary | ICD-10-CM | POA: Diagnosis not present

## 2023-09-27 DIAGNOSIS — I10 Essential (primary) hypertension: Secondary | ICD-10-CM | POA: Diagnosis not present

## 2023-09-27 DIAGNOSIS — H401121 Primary open-angle glaucoma, left eye, mild stage: Secondary | ICD-10-CM | POA: Diagnosis not present

## 2023-09-27 DIAGNOSIS — I700 Atherosclerosis of aorta: Secondary | ICD-10-CM | POA: Diagnosis not present

## 2023-09-27 DIAGNOSIS — K219 Gastro-esophageal reflux disease without esophagitis: Secondary | ICD-10-CM | POA: Diagnosis not present

## 2023-09-27 DIAGNOSIS — E7800 Pure hypercholesterolemia, unspecified: Secondary | ICD-10-CM | POA: Diagnosis not present

## 2023-10-15 DIAGNOSIS — H401121 Primary open-angle glaucoma, left eye, mild stage: Secondary | ICD-10-CM | POA: Diagnosis not present

## 2023-10-15 DIAGNOSIS — H40011 Open angle with borderline findings, low risk, right eye: Secondary | ICD-10-CM | POA: Diagnosis not present

## 2023-10-17 DIAGNOSIS — M170 Bilateral primary osteoarthritis of knee: Secondary | ICD-10-CM | POA: Diagnosis not present

## 2023-10-17 DIAGNOSIS — E7800 Pure hypercholesterolemia, unspecified: Secondary | ICD-10-CM | POA: Diagnosis not present

## 2023-10-17 DIAGNOSIS — I10 Essential (primary) hypertension: Secondary | ICD-10-CM | POA: Diagnosis not present

## 2023-10-17 DIAGNOSIS — E669 Obesity, unspecified: Secondary | ICD-10-CM | POA: Diagnosis not present

## 2023-10-17 DIAGNOSIS — H35033 Hypertensive retinopathy, bilateral: Secondary | ICD-10-CM | POA: Diagnosis not present

## 2023-10-17 DIAGNOSIS — E1169 Type 2 diabetes mellitus with other specified complication: Secondary | ICD-10-CM | POA: Diagnosis not present

## 2023-10-17 DIAGNOSIS — E119 Type 2 diabetes mellitus without complications: Secondary | ICD-10-CM | POA: Diagnosis not present

## 2023-11-16 DIAGNOSIS — H35033 Hypertensive retinopathy, bilateral: Secondary | ICD-10-CM | POA: Diagnosis not present

## 2023-11-16 DIAGNOSIS — I10 Essential (primary) hypertension: Secondary | ICD-10-CM | POA: Diagnosis not present

## 2023-11-16 DIAGNOSIS — M170 Bilateral primary osteoarthritis of knee: Secondary | ICD-10-CM | POA: Diagnosis not present

## 2023-11-16 DIAGNOSIS — E119 Type 2 diabetes mellitus without complications: Secondary | ICD-10-CM | POA: Diagnosis not present

## 2023-11-16 DIAGNOSIS — E1169 Type 2 diabetes mellitus with other specified complication: Secondary | ICD-10-CM | POA: Diagnosis not present

## 2023-11-16 DIAGNOSIS — E669 Obesity, unspecified: Secondary | ICD-10-CM | POA: Diagnosis not present

## 2023-11-16 DIAGNOSIS — E7800 Pure hypercholesterolemia, unspecified: Secondary | ICD-10-CM | POA: Diagnosis not present

## 2023-12-16 DIAGNOSIS — E1169 Type 2 diabetes mellitus with other specified complication: Secondary | ICD-10-CM | POA: Diagnosis not present

## 2023-12-16 DIAGNOSIS — E119 Type 2 diabetes mellitus without complications: Secondary | ICD-10-CM | POA: Diagnosis not present

## 2023-12-16 DIAGNOSIS — I10 Essential (primary) hypertension: Secondary | ICD-10-CM | POA: Diagnosis not present

## 2023-12-16 DIAGNOSIS — H35033 Hypertensive retinopathy, bilateral: Secondary | ICD-10-CM | POA: Diagnosis not present

## 2023-12-17 DIAGNOSIS — H35033 Hypertensive retinopathy, bilateral: Secondary | ICD-10-CM | POA: Diagnosis not present

## 2023-12-17 DIAGNOSIS — M170 Bilateral primary osteoarthritis of knee: Secondary | ICD-10-CM | POA: Diagnosis not present

## 2023-12-17 DIAGNOSIS — E7800 Pure hypercholesterolemia, unspecified: Secondary | ICD-10-CM | POA: Diagnosis not present

## 2023-12-17 DIAGNOSIS — E1169 Type 2 diabetes mellitus with other specified complication: Secondary | ICD-10-CM | POA: Diagnosis not present

## 2023-12-17 DIAGNOSIS — I10 Essential (primary) hypertension: Secondary | ICD-10-CM | POA: Diagnosis not present

## 2023-12-17 DIAGNOSIS — E119 Type 2 diabetes mellitus without complications: Secondary | ICD-10-CM | POA: Diagnosis not present

## 2023-12-17 DIAGNOSIS — E669 Obesity, unspecified: Secondary | ICD-10-CM | POA: Diagnosis not present

## 2024-01-15 DIAGNOSIS — E1169 Type 2 diabetes mellitus with other specified complication: Secondary | ICD-10-CM | POA: Diagnosis not present

## 2024-01-15 DIAGNOSIS — E119 Type 2 diabetes mellitus without complications: Secondary | ICD-10-CM | POA: Diagnosis not present

## 2024-01-15 DIAGNOSIS — I10 Essential (primary) hypertension: Secondary | ICD-10-CM | POA: Diagnosis not present

## 2024-01-15 DIAGNOSIS — H35033 Hypertensive retinopathy, bilateral: Secondary | ICD-10-CM | POA: Diagnosis not present

## 2024-01-16 DIAGNOSIS — E1169 Type 2 diabetes mellitus with other specified complication: Secondary | ICD-10-CM | POA: Diagnosis not present

## 2024-01-16 DIAGNOSIS — H35033 Hypertensive retinopathy, bilateral: Secondary | ICD-10-CM | POA: Diagnosis not present

## 2024-01-16 DIAGNOSIS — M170 Bilateral primary osteoarthritis of knee: Secondary | ICD-10-CM | POA: Diagnosis not present

## 2024-01-16 DIAGNOSIS — E7800 Pure hypercholesterolemia, unspecified: Secondary | ICD-10-CM | POA: Diagnosis not present

## 2024-01-16 DIAGNOSIS — E119 Type 2 diabetes mellitus without complications: Secondary | ICD-10-CM | POA: Diagnosis not present

## 2024-01-16 DIAGNOSIS — E669 Obesity, unspecified: Secondary | ICD-10-CM | POA: Diagnosis not present

## 2024-01-16 DIAGNOSIS — I10 Essential (primary) hypertension: Secondary | ICD-10-CM | POA: Diagnosis not present
